# Patient Record
Sex: Male | Born: 1970
Health system: Southern US, Community
[De-identification: ages and names within clinical notes are randomized; demographics above are authoritative.]

## PROBLEM LIST (undated history)

## (undated) DIAGNOSIS — G250 Essential tremor: Secondary | ICD-10-CM

## (undated) DIAGNOSIS — I10 Essential (primary) hypertension: Secondary | ICD-10-CM

## (undated) DIAGNOSIS — A048 Other specified bacterial intestinal infections: Secondary | ICD-10-CM

## (undated) DIAGNOSIS — K649 Unspecified hemorrhoids: Secondary | ICD-10-CM

## (undated) DIAGNOSIS — E079 Disorder of thyroid, unspecified: Secondary | ICD-10-CM

## (undated) DIAGNOSIS — F32A Depression, unspecified: Secondary | ICD-10-CM

## (undated) DIAGNOSIS — F419 Anxiety disorder, unspecified: Secondary | ICD-10-CM

## (undated) DIAGNOSIS — K589 Irritable bowel syndrome without diarrhea: Secondary | ICD-10-CM

## (undated) DIAGNOSIS — E042 Nontoxic multinodular goiter: Secondary | ICD-10-CM

## (undated) HISTORY — DX: Other specified bacterial intestinal infections: A04.8

## (undated) HISTORY — DX: Irritable bowel syndrome, unspecified: K58.9

## (undated) HISTORY — DX: Unspecified hemorrhoids: K64.9

## (undated) HISTORY — DX: Essential tremor: G25.0

## (undated) HISTORY — DX: Disorder of thyroid, unspecified: E07.9

## (undated) HISTORY — DX: Anxiety disorder, unspecified: F41.9

## (undated) HISTORY — DX: Depression, unspecified: F32.A

---

## 1999-05-12 ENCOUNTER — Emergency Department (HOSPITAL_COMMUNITY): Admission: EM | Admit: 1999-05-12 | Discharge: 1999-05-12 | Payer: Self-pay | Admitting: Emergency Medicine

## 2004-10-15 ENCOUNTER — Emergency Department (HOSPITAL_COMMUNITY): Admission: EM | Admit: 2004-10-15 | Discharge: 2004-10-15 | Payer: Self-pay | Admitting: Family Medicine

## 2005-02-23 ENCOUNTER — Emergency Department (HOSPITAL_COMMUNITY): Admission: EM | Admit: 2005-02-23 | Discharge: 2005-02-23 | Payer: Self-pay | Admitting: Family Medicine

## 2005-12-16 ENCOUNTER — Encounter: Admission: RE | Admit: 2005-12-16 | Discharge: 2005-12-16 | Payer: Self-pay | Admitting: Occupational Medicine

## 2009-03-10 ENCOUNTER — Encounter: Admission: RE | Admit: 2009-03-10 | Discharge: 2009-03-10 | Payer: Self-pay | Admitting: Sports Medicine

## 2009-04-29 ENCOUNTER — Emergency Department (HOSPITAL_COMMUNITY): Admission: EM | Admit: 2009-04-29 | Discharge: 2009-04-29 | Payer: Self-pay | Admitting: Emergency Medicine

## 2009-10-24 ENCOUNTER — Emergency Department (HOSPITAL_COMMUNITY): Admission: EM | Admit: 2009-10-24 | Discharge: 2009-10-24 | Payer: Self-pay | Admitting: Emergency Medicine

## 2013-07-06 ENCOUNTER — Emergency Department (HOSPITAL_COMMUNITY)
Admission: EM | Admit: 2013-07-06 | Discharge: 2013-07-06 | Disposition: A | Discharge status: Home / Self Care | Payer: BC Managed Care – PPO | Attending: Emergency Medicine | Admitting: Emergency Medicine

## 2013-07-06 ENCOUNTER — Encounter (HOSPITAL_COMMUNITY): Payer: Self-pay | Admitting: *Deleted

## 2013-07-06 DIAGNOSIS — Z880 Allergy status to penicillin: Secondary | ICD-10-CM | POA: Insufficient documentation

## 2013-07-06 DIAGNOSIS — T465X5A Adverse effect of other antihypertensive drugs, initial encounter: Secondary | ICD-10-CM | POA: Insufficient documentation

## 2013-07-06 DIAGNOSIS — T783XXA Angioneurotic edema, initial encounter: Secondary | ICD-10-CM | POA: Insufficient documentation

## 2013-07-06 DIAGNOSIS — Y838 Other surgical procedures as the cause of abnormal reaction of the patient, or of later complication, without mention of misadventure at the time of the procedure: Secondary | ICD-10-CM | POA: Insufficient documentation

## 2013-07-06 DIAGNOSIS — I10 Essential (primary) hypertension: Secondary | ICD-10-CM | POA: Insufficient documentation

## 2013-07-06 HISTORY — DX: Essential (primary) hypertension: I10

## 2013-07-06 MED ORDER — DIPHENHYDRAMINE HCL 25 MG PO CAPS
25.0000 mg | ORAL_CAPSULE | Freq: Once | ORAL | Status: AC
  Administered 2013-07-06: 25 mg via ORAL
  Filled 2013-07-06: qty 1

## 2013-07-06 NOTE — ED Provider Notes (Signed)
Medical screening examination/treatment/procedure(s) were performed by non-physician practitioner and as supervising physician I was immediately available for consultation/collaboration.  Sunnie Nielsen, MD 07/06/13 [phone]

## 2013-07-06 NOTE — ED Provider Notes (Signed)
CSN: [id number]     Arrival date & time 07/06/13  0135 History   First MD Initiated Contact with Patient 07/06/13 0350     Chief Complaint  Patient presents with  . Angioedema   (Consider location/radiation/quality/duration/timing/severity/associated sxs/prior Treatment) HPI Comments: Patient presents with upper lip swelling.  He, initially thought that this was a reaction to the dental procedure.  He had yesterday, and the fact that he inadvertently bit.  His upper lip before the feeling returned.  Totally.  He denies any shortness of breath, throat, swelling, tongue swelling.  The history is provided by the patient.    Past Medical History  Diagnosis Date  . Hypertension    History reviewed. No pertinent past surgical history. History reviewed. No pertinent family history. History  Substance Use Topics  . Smoking status: Never Smoker   . Smokeless tobacco: Never Used  . Alcohol Use: No    Review of Systems  Constitutional: Negative for fever.  HENT: Negative for congestion, sore throat, rhinorrhea, trouble swallowing, neck pain and postnasal drip.   Neurological: Negative for facial asymmetry.  All other systems reviewed and are negative.    Allergies  Penicillins  Home Medications   Current Outpatient Rx  Name  Route  Sig  Dispense  Refill  . acetaminophen (TYLENOL) 500 MG tablet   Oral   Take 1,000 mg by mouth every 6 (six) hours as needed for pain.         Marland Kitchen amLODipine-benazepril (LOTREL) 10-20 MG per capsule   Oral   Take 1 capsule by mouth daily.         Marland Kitchen FLUoxetine (PROZAC WEEKLY) 90 MG DR capsule   Oral   Take 90 mg by mouth every 7 (seven) days. On Mondays         . nebivolol (BYSTOLIC) 10 MG tablet   Oral   Take 10 mg by mouth daily.          BP 177/79  Pulse 69  Temp(Src) 98.4 F (36.9 C) (Oral)  Resp 20  Ht 5\' 11"  (1.803 m)  Wt 192 lb (87.091 kg)  BMI 26.79 kg/m2  SpO2 100% Physical Exam  Vitals reviewed. Constitutional: He  appears well-developed and well-nourished.  HENT:  Head: Normocephalic.  Eyes: Pupils are equal, round, and reactive to light.  Neck: Normal range of motion.  Cardiovascular: Normal rate.   Pulmonary/Chest: Effort normal.  Abdominal: Soft.  Musculoskeletal: Normal range of motion.  Neurological: He is alert.  Skin: Skin is warm and dry.    ED Course  Procedures (including critical care time) Labs Review Labs Reviewed - No data to display Imaging Review No results found.  MDM   1. Angioedema of lips, initial encounter     I recommended that the patient.  Stop taking his Lotrel to continue his diastolic is to call his primary care physician.  This morning to report his condition.  I discussed alternative blood pressure medications Patient was given a dose of Benadryl in the emergency department, with little change in his symptoms, making me believe that this is not an allergic reaction, but indeed true, angioedema    Arman Filter, NP 07/06/13 [phone]

## 2013-07-06 NOTE — ED Notes (Signed)
Pt presents with upper lip swelling. Pt states feeling like his lip was started to swell around 1900 last night. Pt awoke from sleep with major swelling in his upper lip. Pt states he had dental work done yesterday morning. Respirations equal and unlabored, skin warm and dry, A&Ox4. Pt states he has already noticed some decrease in swelling since he woke up 45 minutes ago. No other symptoms noted

## 2016-04-03 DIAGNOSIS — R1030 Lower abdominal pain, unspecified: Secondary | ICD-10-CM | POA: Diagnosis not present

## 2016-04-03 DIAGNOSIS — M79652 Pain in left thigh: Secondary | ICD-10-CM | POA: Diagnosis not present

## 2016-04-03 DIAGNOSIS — M79651 Pain in right thigh: Secondary | ICD-10-CM | POA: Diagnosis not present

## 2016-04-09 DIAGNOSIS — E669 Obesity, unspecified: Secondary | ICD-10-CM | POA: Diagnosis not present

## 2016-04-09 DIAGNOSIS — I10 Essential (primary) hypertension: Secondary | ICD-10-CM | POA: Diagnosis not present

## 2016-04-18 DIAGNOSIS — R1030 Lower abdominal pain, unspecified: Secondary | ICD-10-CM | POA: Diagnosis not present

## 2016-09-17 DIAGNOSIS — M542 Cervicalgia: Secondary | ICD-10-CM | POA: Diagnosis not present

## 2016-10-29 DIAGNOSIS — I10 Essential (primary) hypertension: Secondary | ICD-10-CM | POA: Diagnosis not present

## 2016-10-29 DIAGNOSIS — F419 Anxiety disorder, unspecified: Secondary | ICD-10-CM | POA: Diagnosis not present

## 2016-10-29 DIAGNOSIS — F321 Major depressive disorder, single episode, moderate: Secondary | ICD-10-CM | POA: Diagnosis not present

## 2016-11-25 DIAGNOSIS — M722 Plantar fascial fibromatosis: Secondary | ICD-10-CM | POA: Diagnosis not present

## 2016-11-25 DIAGNOSIS — G5762 Lesion of plantar nerve, left lower limb: Secondary | ICD-10-CM | POA: Diagnosis not present

## 2016-11-25 DIAGNOSIS — M2042 Other hammer toe(s) (acquired), left foot: Secondary | ICD-10-CM | POA: Diagnosis not present

## 2016-11-25 DIAGNOSIS — M7752 Other enthesopathy of left foot: Secondary | ICD-10-CM | POA: Diagnosis not present

## 2017-04-28 DIAGNOSIS — M1990 Unspecified osteoarthritis, unspecified site: Secondary | ICD-10-CM | POA: Diagnosis not present

## 2017-04-28 DIAGNOSIS — I10 Essential (primary) hypertension: Secondary | ICD-10-CM | POA: Diagnosis not present

## 2017-04-28 DIAGNOSIS — F419 Anxiety disorder, unspecified: Secondary | ICD-10-CM | POA: Diagnosis not present

## 2017-05-19 ENCOUNTER — Ambulatory Visit (INDEPENDENT_AMBULATORY_CARE_PROVIDER_SITE_OTHER): Payer: BLUE CROSS/BLUE SHIELD | Admitting: Podiatry

## 2017-05-19 ENCOUNTER — Ambulatory Visit (INDEPENDENT_AMBULATORY_CARE_PROVIDER_SITE_OTHER): Payer: BLUE CROSS/BLUE SHIELD

## 2017-05-19 ENCOUNTER — Encounter: Payer: Self-pay | Admitting: Podiatry

## 2017-05-19 VITALS — BP 149/91 | HR 55 | Resp 18

## 2017-05-19 DIAGNOSIS — S8992XA Unspecified injury of left lower leg, initial encounter: Secondary | ICD-10-CM

## 2017-05-19 DIAGNOSIS — S99922A Unspecified injury of left foot, initial encounter: Secondary | ICD-10-CM

## 2017-05-19 DIAGNOSIS — G5762 Lesion of plantar nerve, left lower limb: Secondary | ICD-10-CM

## 2017-05-19 DIAGNOSIS — G5782 Other specified mononeuropathies of left lower limb: Secondary | ICD-10-CM

## 2017-05-19 MED ORDER — MELOXICAM 15 MG PO TABS: 15.0000 mg | ORAL_TABLET | Freq: Every day | ORAL | 2 refills | Status: AC

## 2017-05-20 ENCOUNTER — Telehealth: Payer: Self-pay | Admitting: *Deleted

## 2017-05-20 DIAGNOSIS — D3610 Benign neoplasm of peripheral nerves and autonomic nervous system, unspecified: Secondary | ICD-10-CM

## 2017-05-20 DIAGNOSIS — S99922A Unspecified injury of left foot, initial encounter: Secondary | ICD-10-CM

## 2017-05-20 NOTE — Telephone Encounter (Addendum)
-----   Message from Trula Slade, DPM sent at 05/19/2017  3:13 PM EDT ----- Can you please order a diagnostic ultrasound of the left foot to rule out a neuroma of the 2nd interspace and also to look for a flexor tendon injury/plantar plate injury to the 2nd toe. 05/20/2017-Faxed orders to [place].

## 2017-05-20 NOTE — Progress Notes (Signed)
Subjective:    Patient ID: Earl Hart, male   DOB: 46 y.o.   MRN: [id number]   HPI Mr. Kazi Reppond the office they for concerns of left foot pain which is been ongoing since February 2018. He states that he has done some research online and he feels that he may have a neuroma. He did see another physician for this several months ago and first started he had a steroid injection to the bottom of his foot and to the area which has helped about 1 month. HEENT was told he may have a neuroma but was unsure. He gets occasional numbness and tingling in on a consistent basis. He states that it does get numbness or tingling is in the second and third toes with very intermittent. He denies any recent injury or trauma. He does like to run and do other sporting events but due to pain use had to decrease. Denies any swelling or redness. He said no other complaints.  Review of Systems  All other systems reviewed and are negative.       Objective:  Physical Exam General: AAO x3, NAD  Dermatological: Skin is warm, dry and supple bilateral. Nails x 10 are well manicured; remaining integument appears unremarkable at this time. There are no open sores, no preulcerative lesions, no rash or signs of infection present.  Vascular: Dorsalis Pedis artery and Posterior Tibial artery pedal pulses are 2/4 bilateral with immedate capillary fill time.  There is no pain with calf compression, swelling, warmth, erythema.   Neruologic: Grossly intact via light touch bilateral. Vibratory intact via tuning fork bilateral. Protective threshold with Semmes Wienstein monofilament intact to all pedal sites bilateral.   Musculoskeletal: There is mild tenderness palpation, second interspace left foot. The greatest tenderness is appear to be in the plantar aspect left second toe distal to the MPJ on the plantar aspect of the digit on the plantar plates last flexor tendon. There is no gross subluxation of the MPJ. There is no  overlying edema, erythema, increase in warmth. Unable to palpate a neuroma. There is no area pinpoint bony tenderness or pain the vibratory sensation. There is no overlying edema, erythema, increase in warmth. Muscular strength 5/5 in all groups tested bilateral.  Gait: Unassisted, Nonantalgic.      Assessment:     46 year old male likely plantar plate injury/flexor injury    Plan:     -Treatment options discussed including all alternatives, risks, and complications -Etiology of symptoms were discussed -X-rays were obtained and reviewed with the patient. No evidence of acute fracture identified. Elongated first metatarsal. -At this time is getting more plantar plates last flexor injury. In ultrasound and longevity symptoms to rule out a neuroma and also evaluate the plantar plates last flexor tendon. This is ordered today. -I did show him how to tape the toe down in a plantarflexed position. -Prescribed mobic. Discussed side effects of the medication and directed to stop if any are to occur and call the office.  -RTC after u/s or sooner if needed.  Celesta Gentile, DPM

## 2017-05-23 ENCOUNTER — Ambulatory Visit
Admission: RE | Admit: 2017-05-23 | Discharge: 2017-05-23 | Disposition: A | Discharge status: Home / Self Care | Payer: BLUE CROSS/BLUE SHIELD | Source: Ambulatory Visit | Attending: Podiatry | Admitting: Podiatry

## 2017-05-23 DIAGNOSIS — D3610 Benign neoplasm of peripheral nerves and autonomic nervous system, unspecified: Secondary | ICD-10-CM | POA: Diagnosis not present

## 2017-05-23 DIAGNOSIS — S99922A Unspecified injury of left foot, initial encounter: Secondary | ICD-10-CM

## 2017-05-23 DIAGNOSIS — S8992XA Unspecified injury of left lower leg, initial encounter: Secondary | ICD-10-CM | POA: Diagnosis not present

## 2017-05-27 ENCOUNTER — Telehealth: Payer: Self-pay | Admitting: Podiatry

## 2017-05-27 NOTE — Telephone Encounter (Signed)
I'm returning the nurse Jessica's phone call. She called me yesterday and left a message that she was going to go over the ultrasound results from the ultrasound test I had done last week.Marland Kitchen

## 2017-05-30 ENCOUNTER — Telehealth: Payer: Self-pay

## 2017-05-30 ENCOUNTER — Other Ambulatory Visit: Payer: Self-pay

## 2017-05-30 DIAGNOSIS — D3610 Benign neoplasm of peripheral nerves and autonomic nervous system, unspecified: Secondary | ICD-10-CM

## 2017-05-30 DIAGNOSIS — G5782 Other specified mononeuropathies of left lower limb: Secondary | ICD-10-CM

## 2017-05-30 DIAGNOSIS — S99922A Unspecified injury of left foot, initial encounter: Secondary | ICD-10-CM

## 2017-05-30 NOTE — Telephone Encounter (Signed)
-----   Message from Trula Slade, DPM sent at 05/26/2017 12:06 PM EDT ----- No neuroma. Also, no definitive evidence of flexor injury. Please schedule follow-up in 2 weeks to see how he is doing but please let him know the results.

## 2017-05-30 NOTE — Telephone Encounter (Signed)
Informed patient of ultrasound results, he states that his pain is not getting any better, and was asking if an MRI would be our next step.

## 2017-05-30 NOTE — Telephone Encounter (Signed)
Yes, please order an MRI if he is able to get it.

## 2017-06-10 DIAGNOSIS — M7711 Lateral epicondylitis, right elbow: Secondary | ICD-10-CM | POA: Diagnosis not present

## 2017-06-18 ENCOUNTER — Other Ambulatory Visit: Payer: Self-pay | Admitting: Podiatry

## 2017-06-18 ENCOUNTER — Ambulatory Visit
Admission: RE | Admit: 2017-06-18 | Discharge: 2017-06-18 | Disposition: A | Discharge status: Home / Self Care | Payer: BLUE CROSS/BLUE SHIELD | Source: Ambulatory Visit | Attending: Podiatry | Admitting: Podiatry

## 2017-06-18 DIAGNOSIS — M79672 Pain in left foot: Secondary | ICD-10-CM | POA: Diagnosis not present

## 2017-06-18 DIAGNOSIS — D3610 Benign neoplasm of peripheral nerves and autonomic nervous system, unspecified: Secondary | ICD-10-CM

## 2017-06-18 DIAGNOSIS — S99922A Unspecified injury of left foot, initial encounter: Secondary | ICD-10-CM

## 2017-06-18 DIAGNOSIS — G5782 Other specified mononeuropathies of left lower limb: Secondary | ICD-10-CM

## 2017-07-04 ENCOUNTER — Encounter: Payer: Self-pay | Admitting: Podiatry

## 2017-07-04 ENCOUNTER — Ambulatory Visit (INDEPENDENT_AMBULATORY_CARE_PROVIDER_SITE_OTHER): Payer: BLUE CROSS/BLUE SHIELD | Admitting: Podiatry

## 2017-07-04 DIAGNOSIS — M779 Enthesopathy, unspecified: Secondary | ICD-10-CM

## 2017-07-04 MED ORDER — DICLOFENAC SODIUM 1 % TD GEL: 2.0000 g | Freq: Four times a day (QID) | TRANSDERMAL | 2 refills | Status: DC

## 2017-07-04 MED ORDER — CLOTRIMAZOLE-BETAMETHASONE 1-0.05 % EX CREA: 1.0000 "application " | TOPICAL_CREAM | Freq: Two times a day (BID) | CUTANEOUS | 0 refills | Status: DC

## 2017-07-08 NOTE — Progress Notes (Signed)
Subjective: Earl Hart presents to the office they for follow-up evaluation of left foot pain and discussed MRI results. He states he is doing somewhat better to the left foot he has continued to run and be active. Overall his foot is improving but he does continue to have pain which is been ongoing since February 2018. He denies any specific injury or trauma to his foot. He has not been having any swelling. Denies any systemic complaints such as fevers, chills, nausea, vomiting. No acute changes since last appointment, and no other complaints at this time.   Objective: AAO x3, NAD DP/PT pulses palpable bilaterally, CRT less than 3 seconds Discontinuation tenderness on palpation to the second interspace and left foot. Unable to palpate any neuroma. Is also tenderness along the first interspace. There is no pain with MPJ range of motion. There is no overlying edema, erythema, increase in warmth. There is no specific area pinpoint bony tenderness or pain the vibratory sensation.  No open lesions or pre-ulcerative lesions.  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   Plan: -All treatment options discussed with the patient including all alternatives, risks, complications.  -MRI results were discussed with the patient. He declined surgical shoe vented allograft side insert inside of his shoe to help stiffen this. As he is doing better and we'll hold off on the steroid injection today but discussed that this is an option for him as well. Continue ice to the area. Discussed decrease in activity level until pain decreases.  -Follow-up in 6 weeks if symptoms continue or sooner if needed.  -Patient encouraged to call the office with any questions, concerns, change in symptoms.   Celesta Gentile, DPM

## 2017-07-10 DIAGNOSIS — M7711 Lateral epicondylitis, right elbow: Secondary | ICD-10-CM | POA: Diagnosis not present

## 2017-07-29 DIAGNOSIS — L245 Irritant contact dermatitis due to other chemical products: Secondary | ICD-10-CM | POA: Diagnosis not present

## 2017-08-22 ENCOUNTER — Ambulatory Visit: Payer: BLUE CROSS/BLUE SHIELD | Admitting: Podiatry

## 2017-10-30 DIAGNOSIS — F410 Panic disorder [episodic paroxysmal anxiety] without agoraphobia: Secondary | ICD-10-CM | POA: Diagnosis not present

## 2017-10-30 DIAGNOSIS — M1990 Unspecified osteoarthritis, unspecified site: Secondary | ICD-10-CM | POA: Diagnosis not present

## 2017-10-30 DIAGNOSIS — I10 Essential (primary) hypertension: Secondary | ICD-10-CM | POA: Diagnosis not present

## 2018-02-11 DIAGNOSIS — H6240 Otitis externa in other diseases classified elsewhere, unspecified ear: Secondary | ICD-10-CM | POA: Diagnosis not present

## 2018-02-11 DIAGNOSIS — B369 Superficial mycosis, unspecified: Secondary | ICD-10-CM | POA: Diagnosis not present

## 2018-02-25 DIAGNOSIS — B369 Superficial mycosis, unspecified: Secondary | ICD-10-CM | POA: Diagnosis not present

## 2018-02-25 DIAGNOSIS — H6240 Otitis externa in other diseases classified elsewhere, unspecified ear: Secondary | ICD-10-CM | POA: Diagnosis not present

## 2018-02-25 DIAGNOSIS — H6983 Other specified disorders of Eustachian tube, bilateral: Secondary | ICD-10-CM | POA: Diagnosis not present

## 2018-04-22 DIAGNOSIS — M542 Cervicalgia: Secondary | ICD-10-CM | POA: Diagnosis not present

## 2018-06-25 DIAGNOSIS — I10 Essential (primary) hypertension: Secondary | ICD-10-CM | POA: Diagnosis not present

## 2018-06-25 DIAGNOSIS — F321 Major depressive disorder, single episode, moderate: Secondary | ICD-10-CM | POA: Diagnosis not present

## 2018-06-25 DIAGNOSIS — F410 Panic disorder [episodic paroxysmal anxiety] without agoraphobia: Secondary | ICD-10-CM | POA: Diagnosis not present

## 2018-06-25 DIAGNOSIS — M799 Soft tissue disorder, unspecified: Secondary | ICD-10-CM | POA: Diagnosis not present

## 2018-10-23 DIAGNOSIS — J329 Chronic sinusitis, unspecified: Secondary | ICD-10-CM | POA: Diagnosis not present

## 2018-10-23 DIAGNOSIS — J300 Vasomotor rhinitis: Secondary | ICD-10-CM | POA: Diagnosis not present

## 2018-11-17 DIAGNOSIS — J300 Vasomotor rhinitis: Secondary | ICD-10-CM | POA: Diagnosis not present

## 2018-11-17 DIAGNOSIS — H6993 Unspecified Eustachian tube disorder, bilateral: Secondary | ICD-10-CM | POA: Diagnosis not present

## 2018-11-17 DIAGNOSIS — J310 Chronic rhinitis: Secondary | ICD-10-CM | POA: Diagnosis not present

## 2019-01-15 DIAGNOSIS — I10 Essential (primary) hypertension: Secondary | ICD-10-CM | POA: Diagnosis not present

## 2019-01-15 DIAGNOSIS — F321 Major depressive disorder, single episode, moderate: Secondary | ICD-10-CM | POA: Diagnosis not present

## 2019-01-15 DIAGNOSIS — F410 Panic disorder [episodic paroxysmal anxiety] without agoraphobia: Secondary | ICD-10-CM | POA: Diagnosis not present

## 2019-01-15 DIAGNOSIS — M799 Soft tissue disorder, unspecified: Secondary | ICD-10-CM | POA: Diagnosis not present

## 2019-01-19 ENCOUNTER — Other Ambulatory Visit: Payer: Self-pay | Admitting: Pulmonary Disease

## 2019-01-19 DIAGNOSIS — E041 Nontoxic single thyroid nodule: Secondary | ICD-10-CM

## 2019-01-27 ENCOUNTER — Ambulatory Visit
Admission: RE | Admit: 2019-01-27 | Discharge: 2019-01-27 | Disposition: A | Discharge status: Home / Self Care | Payer: BLUE CROSS/BLUE SHIELD | Source: Ambulatory Visit | Attending: Pulmonary Disease | Admitting: Pulmonary Disease

## 2019-01-27 ENCOUNTER — Other Ambulatory Visit: Payer: Self-pay

## 2019-01-27 DIAGNOSIS — E041 Nontoxic single thyroid nodule: Secondary | ICD-10-CM | POA: Diagnosis not present

## 2019-02-01 ENCOUNTER — Other Ambulatory Visit: Payer: Self-pay | Admitting: Pulmonary Disease

## 2019-02-01 DIAGNOSIS — R5381 Other malaise: Secondary | ICD-10-CM

## 2019-03-05 ENCOUNTER — Other Ambulatory Visit: Payer: BLUE CROSS/BLUE SHIELD

## 2019-03-09 ENCOUNTER — Other Ambulatory Visit: Payer: Self-pay | Admitting: Pulmonary Disease

## 2019-03-09 DIAGNOSIS — E041 Nontoxic single thyroid nodule: Secondary | ICD-10-CM

## 2019-03-11 ENCOUNTER — Other Ambulatory Visit (HOSPITAL_COMMUNITY)
Admission: RE | Admit: 2019-03-11 | Discharge: 2019-03-11 | Disposition: A | Discharge status: Home / Self Care | Payer: BLUE CROSS/BLUE SHIELD | Source: Ambulatory Visit | Attending: Radiology | Admitting: Radiology

## 2019-03-11 ENCOUNTER — Other Ambulatory Visit: Payer: Self-pay

## 2019-03-11 ENCOUNTER — Ambulatory Visit
Admission: RE | Admit: 2019-03-11 | Discharge: 2019-03-11 | Disposition: A | Discharge status: Home / Self Care | Payer: BLUE CROSS/BLUE SHIELD | Source: Ambulatory Visit | Attending: Pulmonary Disease | Admitting: Pulmonary Disease

## 2019-03-11 DIAGNOSIS — E041 Nontoxic single thyroid nodule: Secondary | ICD-10-CM | POA: Insufficient documentation

## 2019-03-17 DIAGNOSIS — E049 Nontoxic goiter, unspecified: Secondary | ICD-10-CM | POA: Diagnosis not present

## 2019-03-17 DIAGNOSIS — J301 Allergic rhinitis due to pollen: Secondary | ICD-10-CM | POA: Diagnosis not present

## 2019-03-29 DIAGNOSIS — H1045 Other chronic allergic conjunctivitis: Secondary | ICD-10-CM | POA: Diagnosis not present

## 2019-03-29 DIAGNOSIS — J309 Allergic rhinitis, unspecified: Secondary | ICD-10-CM | POA: Diagnosis not present

## 2019-03-29 DIAGNOSIS — J3089 Other allergic rhinitis: Secondary | ICD-10-CM | POA: Diagnosis not present

## 2019-04-09 DIAGNOSIS — M542 Cervicalgia: Secondary | ICD-10-CM | POA: Diagnosis not present

## 2019-04-24 DIAGNOSIS — M542 Cervicalgia: Secondary | ICD-10-CM | POA: Diagnosis not present

## 2019-04-30 DIAGNOSIS — M542 Cervicalgia: Secondary | ICD-10-CM | POA: Diagnosis not present

## 2019-06-01 DIAGNOSIS — H9209 Otalgia, unspecified ear: Secondary | ICD-10-CM | POA: Diagnosis not present

## 2019-06-01 DIAGNOSIS — J310 Chronic rhinitis: Secondary | ICD-10-CM | POA: Diagnosis not present

## 2019-06-01 DIAGNOSIS — J343 Hypertrophy of nasal turbinates: Secondary | ICD-10-CM | POA: Diagnosis not present

## 2019-06-01 DIAGNOSIS — J342 Deviated nasal septum: Secondary | ICD-10-CM | POA: Diagnosis not present

## 2019-06-01 DIAGNOSIS — D440 Neoplasm of uncertain behavior of thyroid gland: Secondary | ICD-10-CM | POA: Diagnosis not present

## 2019-06-14 ENCOUNTER — Telehealth: Payer: Self-pay | Admitting: Internal Medicine

## 2019-06-14 NOTE — Telephone Encounter (Signed)
Patient called about his referral. He stated he spoke to someone last week and the referral was in review.  Calling to check status

## 2019-07-05 ENCOUNTER — Other Ambulatory Visit: Payer: Self-pay | Admitting: Family Medicine

## 2019-07-05 DIAGNOSIS — B9681 Helicobacter pylori [H. pylori] as the cause of diseases classified elsewhere: Secondary | ICD-10-CM | POA: Diagnosis not present

## 2019-07-05 DIAGNOSIS — R1011 Right upper quadrant pain: Secondary | ICD-10-CM

## 2019-07-05 DIAGNOSIS — R109 Unspecified abdominal pain: Secondary | ICD-10-CM | POA: Diagnosis not present

## 2019-07-05 NOTE — Progress Notes (Signed)
6 mo increasing frequency and intensity of RUQ pain. Typically after meals. 3 days ago pain came on acutely after meal of salad and fried fish. Stools became lighter and softer and more explosive. Constant pain w/ waxing and waning nature. Still w/ protein intake since that time w/o further worsening. FMHx of Cholecystectomy. No ETOH use.    Linna Darner, MD Family Medicine 07/05/2019, 10:27 AM

## 2019-07-05 NOTE — Addendum Note (Signed)
Addended by: Marily Memos, DAVID J on: 07/05/2019 10:41 AM   Modules accepted: Orders

## 2019-07-06 ENCOUNTER — Other Ambulatory Visit: Payer: BLUE CROSS/BLUE SHIELD

## 2019-07-06 ENCOUNTER — Ambulatory Visit
Admission: RE | Admit: 2019-07-06 | Discharge: 2019-07-06 | Disposition: A | Discharge status: Home / Self Care | Payer: BLUE CROSS/BLUE SHIELD | Source: Ambulatory Visit | Attending: Family Medicine | Admitting: Family Medicine

## 2019-07-06 DIAGNOSIS — K824 Cholesterolosis of gallbladder: Secondary | ICD-10-CM | POA: Diagnosis not present

## 2019-07-06 DIAGNOSIS — E042 Nontoxic multinodular goiter: Secondary | ICD-10-CM | POA: Diagnosis not present

## 2019-07-06 DIAGNOSIS — R1011 Right upper quadrant pain: Secondary | ICD-10-CM

## 2019-07-06 NOTE — Telephone Encounter (Signed)
Referral in proficient awaiting review

## 2019-07-07 ENCOUNTER — Other Ambulatory Visit: Payer: Self-pay | Admitting: Family Medicine

## 2019-07-08 ENCOUNTER — Other Ambulatory Visit: Payer: Self-pay | Admitting: Otolaryngology

## 2019-07-09 ENCOUNTER — Other Ambulatory Visit: Payer: Self-pay | Admitting: Family Medicine

## 2019-07-09 ENCOUNTER — Other Ambulatory Visit (HOSPITAL_COMMUNITY): Payer: Self-pay | Admitting: Family Medicine

## 2019-07-09 DIAGNOSIS — R1013 Epigastric pain: Secondary | ICD-10-CM

## 2019-07-09 DIAGNOSIS — R1084 Generalized abdominal pain: Secondary | ICD-10-CM

## 2019-07-09 DIAGNOSIS — R1011 Right upper quadrant pain: Secondary | ICD-10-CM

## 2019-07-09 DIAGNOSIS — R195 Other fecal abnormalities: Secondary | ICD-10-CM

## 2019-07-09 NOTE — Progress Notes (Signed)
9.21.20 Epigastric pain. Started Friday acutely after meal. Radiation to RUQ and less so to back. Sharp in nature. Episodes similar to this have occured multiple times over past 6 mo. THis episode far worse than any previous episodes. Associated w/ feeling week and shaky as well as very loose and lighter colored stool. Explosive type BMs. Sometimes green. No bloating. Meal just prior to onset of sx was fried fish and salad. States very little to no pork or beef intake. Rare poultry intake. Afebrile. Pain is constant w/ waxing and waning nature.   07/09/19 after return of H.Pylori Iga and IgG positive (9.23.20) pt started on quadruple therapy. Minimal improvement after 2 days of therapy. Stools remain loose. Dark since starting pepto.   Discussed ED eval vs GI vs MRI.   Pt opting for MRI.

## 2019-07-16 ENCOUNTER — Ambulatory Visit
Admission: RE | Admit: 2019-07-16 | Discharge: 2019-07-16 | Disposition: A | Discharge status: Home / Self Care | Payer: BC Managed Care – PPO | Source: Ambulatory Visit | Attending: Family Medicine | Admitting: Family Medicine

## 2019-07-16 DIAGNOSIS — R1013 Epigastric pain: Secondary | ICD-10-CM | POA: Diagnosis not present

## 2019-07-16 DIAGNOSIS — R1011 Right upper quadrant pain: Secondary | ICD-10-CM | POA: Diagnosis not present

## 2019-07-16 DIAGNOSIS — R195 Other fecal abnormalities: Secondary | ICD-10-CM

## 2019-07-16 MED ORDER — GADOBENATE DIMEGLUMINE 529 MG/ML IV SOLN
18.0000 mL | Freq: Once | INTRAVENOUS | Status: AC | PRN
  Administered 2019-07-16: 18 mL via INTRAVENOUS

## 2019-07-19 NOTE — Telephone Encounter (Signed)
Pt is being seen elsewhere °

## 2019-07-23 ENCOUNTER — Ambulatory Visit: Payer: Self-pay | Admitting: Surgery

## 2019-07-23 DIAGNOSIS — E042 Nontoxic multinodular goiter: Secondary | ICD-10-CM | POA: Diagnosis not present

## 2019-07-23 DIAGNOSIS — E049 Nontoxic goiter, unspecified: Secondary | ICD-10-CM | POA: Diagnosis not present

## 2019-07-23 DIAGNOSIS — J398 Other specified diseases of upper respiratory tract: Secondary | ICD-10-CM | POA: Diagnosis not present

## 2019-08-02 ENCOUNTER — Encounter: Payer: Self-pay | Admitting: Physician Assistant

## 2019-08-03 ENCOUNTER — Encounter (HOSPITAL_BASED_OUTPATIENT_CLINIC_OR_DEPARTMENT_OTHER): Payer: Self-pay

## 2019-08-03 ENCOUNTER — Ambulatory Visit (HOSPITAL_BASED_OUTPATIENT_CLINIC_OR_DEPARTMENT_OTHER): Admit: 2019-08-03 | Payer: BC Managed Care – PPO | Admitting: Otolaryngology

## 2019-08-03 SURGERY — THYROIDECTOMY
Anesthesia: General

## 2019-08-06 ENCOUNTER — Other Ambulatory Visit: Payer: Self-pay

## 2019-08-06 ENCOUNTER — Ambulatory Visit (INDEPENDENT_AMBULATORY_CARE_PROVIDER_SITE_OTHER): Payer: BC Managed Care – PPO | Admitting: Physician Assistant

## 2019-08-06 ENCOUNTER — Encounter: Payer: Self-pay | Admitting: Physician Assistant

## 2019-08-06 VITALS — BP 128/82 | HR 60 | Temp 97.8°F | Ht 71.0 in | Wt 204.0 lb

## 2019-08-06 DIAGNOSIS — Z1211 Encounter for screening for malignant neoplasm of colon: Secondary | ICD-10-CM | POA: Diagnosis not present

## 2019-08-06 DIAGNOSIS — Z1159 Encounter for screening for other viral diseases: Secondary | ICD-10-CM

## 2019-08-06 DIAGNOSIS — Z1212 Encounter for screening for malignant neoplasm of rectum: Secondary | ICD-10-CM

## 2019-08-06 DIAGNOSIS — R1013 Epigastric pain: Secondary | ICD-10-CM | POA: Diagnosis not present

## 2019-08-06 DIAGNOSIS — Z8619 Personal history of other infectious and parasitic diseases: Secondary | ICD-10-CM

## 2019-08-06 MED ORDER — NA SULFATE-K SULFATE-MG SULF 17.5-3.13-1.6 GM/177ML PO SOLN: 1.0000 | ORAL | 0 refills | Status: DC

## 2019-08-06 NOTE — Patient Instructions (Addendum)
  You have been scheduled for an endoscopy and colonoscopy. Please follow the written instructions given to you at your visit today. Please pick up your prep supplies at the pharmacy within the next 1-3 days. If you use inhalers (even only as needed), please bring them with you on the day of your procedure.  Due to recent COVID-19 restrictions implemented by [employer] and state authorities and in an effort to keep both patients and staff as safe as possible, [place] requires COVID-19 testing prior to any scheduled endoscopic procedure. The testing center is located at [address] [place],  [address] in the [facility] [employer]  suite.  Your appointment has been scheduled for 8:30 am on 08-12-2019.   Please bring your insurance cards to this appointment. You will require your COVID screen 2 business days prior to your endoscopic procedure.  You are not required to quarantine after your screening.  You will only receive a phone call with the results if it is POSITIVE.  If you do not receive a call the day before your procedure you should begin your prep, if ordered, and you should report to the endo center for your procedure at your designated appointment arrival time ( one hour prior to the procedure time). There is no cost to you for the screening on the day of the swab.  [facility] will file with your insurance company for the testing.    You may receive an automated phone call prior to your procedure or have a message in your MyChart that you have an appointment for a BP/15 at the [facility], please disregard this message.  Your testing will be at the [address] [place] location.   If you are leaving [facility] travel [place] on [address], turn left onto [address], turn night onto [address] at the 1st stop light turn right, pass  the [employer] on your right and proceed to [place] (white building).

## 2019-08-06 NOTE — Progress Notes (Signed)
Chief Complaint: "Indigestion, abdominal pain and colored stools"  HPI:    [name] is a 48 year old African-American male with a past medical history as listed below, who was referred to me by Earl Du, MD for a complaint of indigestion, abdominal pain and colored stools.      07/09/2019 PCP described patient had right upper quadrant pain which started acutely on 07/05/2019 with similar episodes over the past 6 months.  Associated with feeling weak and shaky as well as with very loose and lighter colored stool, had eaten fried fish and salad just prior to symptoms.  Patient had positive H. pylori IgA and IgG on 923/20 and patient was started on quadruple therapy with minimal improvement after 2 days.    07/06/2019 right upper quadrant ultrasound showed a 3 mm gallbladder polyp and otherwise negative.    07/16/2019 MRI of the abdomen with and without contrast was negative.    Today, the patient tells me that back on 07/04/2019 he started with right upper quadrant/epigastric pain which was constant and so severe, rated as an 8-9/10, that he proceeded to have multiple tests including an ultrasound and lab work which he tells me showed H. pylori and was otherwise normal. he was started on triple therapy and took this for 2 weeks.  During those 2 weeks the patient tells me that he was having green loose stools every time he would go to the bathroom and they somewhat burned when they came out.  He used Tucks pads to help with the burn.  Also described gurgling in his abdomen.  After being on the antibiotics he continued with some shooting pains which would come and go throughout the day and which still continue now along with "noisy gurgling which is embarrassing".  Explained that he then had an MRI which was normal.  Patient tells me all of his problems are about 50% better.  His stool has returned to brown over the past 3 to 4 days.  He did start Culturelle a couple of weeks ago and does drink a daily  Kambucha per recommendations from his PCP.  Patient is still worried about what might be going on.    Patient also has heard that African-Americans are at increased risk for colon cancer and wants to know about scheduling a screening colonoscopy.    Denies fever, chills, blood in his stool, weight loss, nausea, vomiting or reflux.  Past Medical History:  Diagnosis Date   Hypertension     No past surgical history on file.  Current Outpatient Medications  Medication Sig Dispense Refill   acetaminophen (TYLENOL) 500 MG tablet Take 1,000 mg by mouth every 6 (six) hours as needed for pain.     amLODipine (NORVASC) 10 MG tablet Take 10 mg by mouth daily.  3   FLUoxetine (PROZAC WEEKLY) 90 MG DR capsule Take 90 mg by mouth every 7 (seven) days. On Mondays     hydrochlorothiazide (HYDRODIURIL) 12.5 MG tablet Take 12.5 mg by mouth daily.  3   nebivolol (BYSTOLIC) 10 MG tablet Take 10 mg by mouth daily.     No current facility-administered medications for this visit.     Allergies as of 08/06/2019 - Review Complete 07/04/2017  Allergen Reaction Noted   Penicillins Rash 07/06/2013    No family history on file.  Social History   Socioeconomic History   Marital status: Single    Spouse name: Not on file   Number of children: Not on file  Years of education: Not on file   Highest education level: Not on file  Occupational History   Not on file  Social Needs   Financial resource strain: Not on file   Food insecurity    Worry: Not on file    Inability: Not on file   Transportation needs    Medical: Not on file    Non-medical: Not on file  Tobacco Use   Smoking status: Never Smoker   Smokeless tobacco: Never Used  Substance and Sexual Activity   Alcohol use: No   Drug use: No   Sexual activity: Not on file  Lifestyle   Physical activity    Days per week: Not on file    Minutes per session: Not on file   Stress: Not on file  Relationships   Social  connections    Talks on phone: Not on file    Gets together: Not on file    Attends religious service: Not on file    Active member of club or organization: Not on file    Attends meetings of clubs or organizations: Not on file    Relationship status: Not on file   Intimate partner violence    Fear of current or ex partner: Not on file    Emotionally abused: Not on file    Physically abused: Not on file    Forced sexual activity: Not on file  Other Topics Concern   Not on file  Social History Narrative   Not on file    Review of Systems:    Constitutional: No weight loss, fever or chills Skin: No rash  Cardiovascular: No chest pain Respiratory: No SOB  Gastrointestinal: See HPI and otherwise negative Genitourinary: No dysuria Neurological: No headache, dizziness or syncope Musculoskeletal: No new muscle or joint pain Hematologic: No bleeding  Psychiatric: No history of depression or anxiety   Physical Exam:  Vital signs: BP 128/82 (BP Location: Right Arm, Patient Position: Sitting, Cuff Size: Normal)    Pulse 60    Temp 97.8 F (36.6 C) (Other (Comment))    Ht 5\' 11"  (1.803 m)    Wt 204 lb (92.5 kg)    BMI 28.45 kg/m   Constitutional:   Pleasant AA male appears to be in NAD, Well developed, Well nourished, alert and cooperative Head:  Normocephalic and atraumatic. Eyes:   PEERL, EOMI. No icterus. Conjunctiva pink. Ears:  Normal auditory acuity. Neck:  Supple Throat: Oral cavity and pharynx without inflammation, swelling or lesion.  Respiratory: Respirations even and unlabored. Lungs clear to auscultation bilaterally.   No wheezes, crackles, or rhonchi.  Cardiovascular: Normal S1, S2. No MRG. Regular rate and rhythm. No peripheral edema, cyanosis or pallor.  Gastrointestinal:  Soft, nondistended, nontender. No rebound or guarding. Normal bowel sounds. No appreciable masses or hepatomegaly. Rectal:  Not performed.  Msk:  Symmetrical without gross deformities. Without  edema, no deformity or joint abnormality.  Neurologic:  Alert and  oriented x4;  grossly normal neurologically.  Skin:   Dry and intact without significant lesions or rashes. Psychiatric:  Demonstrates good judgement and reason without abnormal affect or behaviors.  Assessment: 1.  History of H. pylori: Sounds like a positive antibody test, finished treatment 2 weeks ago 2.  Epigastric pain: Continues per the patient, though 50% better after triple therapy for H. pylori which he finished 2 weeks ago, right upper quadrant ultrasound and abdominal MRI normal; consider continued H. Pylori+/-gastritis+/-PUD 3.  Screening for colorectal cancer: Patient is  African-American and 48 years old  Plan: 1.  Scheduled patient for diagnostic EGD and screening colonoscopy in the [place] with Dr. Silverio Decamp.  Did discuss risks, benefits, limitations and alternatives. 2.  Patient will call his insurance to ensure that they will pay for an early screening for him since he is an African-American and at increased risk for CRC 3.  Tried to reassure the patient that things sound as though they are returning to normal, green stool can be a normal variant, and this is now returning to normal. 4.  Patient to follow in clinic per recommendations from Dr. Silverio Decamp after time of procedures.  Ellouise Newer, PA-C [facility] 08/06/2019, 8:27 AM  Cc: Earl Du, MD

## 2019-08-12 DIAGNOSIS — Z1159 Encounter for screening for other viral diseases: Secondary | ICD-10-CM | POA: Diagnosis not present

## 2019-08-12 LAB — SARS CORONAVIRUS 2 (TAT 6-24 HRS): SARS Coronavirus 2: NEGATIVE

## 2019-08-13 ENCOUNTER — Encounter: Payer: Self-pay | Admitting: Gastroenterology

## 2019-08-16 ENCOUNTER — Encounter: Payer: Self-pay | Admitting: Gastroenterology

## 2019-08-16 ENCOUNTER — Other Ambulatory Visit: Payer: Self-pay

## 2019-08-16 ENCOUNTER — Ambulatory Visit (AMBULATORY_SURGERY_CENTER): Payer: BC Managed Care – PPO | Admitting: Gastroenterology

## 2019-08-16 VITALS — BP 114/62 | HR 59 | Temp 99.1°F | Resp 19 | Ht 71.0 in | Wt 204.0 lb

## 2019-08-16 DIAGNOSIS — Z1211 Encounter for screening for malignant neoplasm of colon: Secondary | ICD-10-CM | POA: Diagnosis not present

## 2019-08-16 DIAGNOSIS — Z1212 Encounter for screening for malignant neoplasm of rectum: Secondary | ICD-10-CM | POA: Diagnosis not present

## 2019-08-16 DIAGNOSIS — Z8619 Personal history of other infectious and parasitic diseases: Secondary | ICD-10-CM

## 2019-08-16 DIAGNOSIS — D122 Benign neoplasm of ascending colon: Secondary | ICD-10-CM | POA: Diagnosis not present

## 2019-08-16 DIAGNOSIS — K2950 Unspecified chronic gastritis without bleeding: Secondary | ICD-10-CM | POA: Diagnosis not present

## 2019-08-16 DIAGNOSIS — D123 Benign neoplasm of transverse colon: Secondary | ICD-10-CM

## 2019-08-16 DIAGNOSIS — R1013 Epigastric pain: Secondary | ICD-10-CM

## 2019-08-16 DIAGNOSIS — K635 Polyp of colon: Secondary | ICD-10-CM | POA: Diagnosis not present

## 2019-08-16 HISTORY — PX: UPPER GASTROINTESTINAL ENDOSCOPY: SHX188

## 2019-08-16 HISTORY — PX: COLONOSCOPY: SHX174

## 2019-08-16 MED ORDER — OMEPRAZOLE 40 MG PO CPDR: 40.0000 mg | DELAYED_RELEASE_CAPSULE | Freq: Every day | ORAL | 3 refills | Status: DC

## 2019-08-16 MED ORDER — SODIUM CHLORIDE 0.9 % IV SOLN: 500.0000 mL | Freq: Once | INTRAVENOUS | Status: DC

## 2019-08-16 NOTE — Op Note (Signed)
[facility] Patient Name: Earl Hart Procedure Date: 08/16/2019 3:25 PM MRN: [id number] Endoscopist: Mauri Pole , MD Age: 47 Referring MD:  Date of Birth: July 06, 1971 Gender: Male Account #: [id number] Procedure:                Colonoscopy Indications:              Screening for colorectal malignant neoplasm Medicines:                Monitored Anesthesia Care Procedure:                Pre-Anesthesia Assessment:                           - Prior to the procedure, a History and Physical                            was performed, and patient medications and                            allergies were reviewed. The patient's tolerance of                            previous anesthesia was also reviewed. The risks                            and benefits of the procedure and the sedation                            options and risks were discussed with the patient.                            All questions were answered, and informed consent                            was obtained. Prior Anticoagulants: The patient has                            taken no previous anticoagulant or antiplatelet                            agents. ASA Grade Assessment: II - A patient with                            mild systemic disease. After reviewing the risks                            and benefits, the patient was deemed in                            satisfactory condition to undergo the procedure.                           After obtaining informed consent, the colonoscope  was passed under direct vision. Throughout the                            procedure, the patient's blood pressure, pulse, and                            oxygen saturations were monitored continuously. The                            Colonoscope was introduced through the anus and                            advanced to the the cecum, identified by                            appendiceal orifice and  ileocecal valve. The                            colonoscopy was performed without difficulty. The                            patient tolerated the procedure well. The quality                            of the bowel preparation was fair. The ileocecal                            valve, appendiceal orifice, and rectum were                            photographed. Scope In: 3:39:50 PM Scope Out: 4:01:02 PM Scope Withdrawal Time: 0 hours 16 minutes 12 seconds  Total Procedure Duration: 0 hours 21 minutes 12 seconds  Findings:                 The perianal and digital rectal examinations were                            normal.                           A less than 1 mm polyp was found in the ascending                            colon. The polyp was sessile. The polyp was removed                            with a cold biopsy forceps. Resection and retrieval                            were complete.                           A 3 mm polyp was found in the transverse colon. The  polyp was sessile. The polyp was removed with a                            cold snare. Resection and retrieval were complete.                           Non-bleeding internal hemorrhoids were found during                            retroflexion. The hemorrhoids were small.                           The exam was otherwise without abnormality. Complications:            No immediate complications. Estimated Blood Loss:     Estimated blood loss was minimal. Impression:               - Preparation of the colon was fair.                           - One less than 1 mm polyp in the ascending colon,                            removed with a cold biopsy forceps. Resected and                            retrieved.                           - One 3 mm polyp in the transverse colon, removed                            with a cold snare. Resected and retrieved.                           - Non-bleeding internal  hemorrhoids.                           - The examination was otherwise normal. Recommendation:           - Patient has a contact number available for                            emergencies. The signs and symptoms of potential                            delayed complications were discussed with the                            patient. Return to normal activities tomorrow.                            Written discharge instructions were provided to the  patient.                           - Resume previous diet.                           - Continue present medications.                           - Await pathology results.                           - Repeat colonoscopy in 5 years for surveillance                            based on pathology results. Mauri Pole, MD 08/16/2019 4:12:28 PM This report has been signed electronically.

## 2019-08-16 NOTE — Patient Instructions (Signed)
YOU HAD AN ENDOSCOPIC PROCEDURE TODAY AT THE [facility]:   Refer to the procedure report that was given to you for any specific questions about what was found during the examination.  If the procedure report does not answer your questions, please call your gastroenterologist to clarify.  If you requested that your care partner not be given the details of your procedure findings, then the procedure report has been included in a sealed envelope for you to review at your convenience later.  YOU SHOULD EXPECT: Some feelings of bloating in the abdomen. Passage of more gas than usual.  Walking can help get rid of the air that was put into your GI tract during the procedure and reduce the bloating. If you had a lower endoscopy (such as a colonoscopy or flexible sigmoidoscopy) you may notice spotting of blood in your stool or on the toilet paper. If you underwent a bowel prep for your procedure, you may not have a normal bowel movement for a few days.  Please Note:  You might notice some irritation and congestion in your nose or some drainage.  This is from the oxygen used during your procedure.  There is no need for concern and it should clear up in a day or so.  SYMPTOMS TO REPORT IMMEDIATELY:   Following lower endoscopy (colonoscopy or flexible sigmoidoscopy):  Excessive amounts of blood in the stool  Significant tenderness or worsening of abdominal pains  Swelling of the abdomen that is new, acute  Fever of 100F or higher   Following upper endoscopy (EGD)  Vomiting of blood or coffee ground material  New chest pain or pain under the shoulder blades  Painful or persistently difficult swallowing  New shortness of breath  Fever of 100F or higher  Black, tarry-looking stools  For urgent or emergent issues, a gastroenterologist can be reached at any hour by calling [phone].   DIET:  We do recommend a small meal at first, but then you may proceed to your regular diet.  Drink  plenty of fluids but you should avoid alcoholic beverages for 24 hours.  ACTIVITY:  You should plan to take it easy for the rest of today and you should NOT DRIVE or use heavy machinery until tomorrow (because of the sedation medicines used during the test).    FOLLOW UP: Our staff will call the number listed on your records 48-72 hours following your procedure to check on you and address any questions or concerns that you may have regarding the information given to you following your procedure. If we do not reach you, we will leave a message.  We will attempt to reach you two times.  During this call, we will ask if you have developed any symptoms of COVID 19. If you develop any symptoms (ie: fever, flu-like symptoms, shortness of breath, cough etc.) before then, please call [phone].  If you test positive for Covid 19 in the 2 weeks post procedure, please call and report this information to us.    If any biopsies were taken you will be contacted by phone or by letter within the next 1-3 weeks.  Please call us at [phone] if you have not heard about the biopsies in 3 weeks.    SIGNATURES/CONFIDENTIALITY: You and/or your care partner have signed paperwork which will be entered into your electronic medical record.  These signatures attest to the fact that that the information above on your After Visit Summary has been reviewed and is   understood.  Full responsibility of the confidentiality of this discharge information lies with you and/or your care-partner. 

## 2019-08-16 NOTE — Op Note (Signed)
[facility] Patient Name: Earl Hart Procedure Date: 08/16/2019 3:28 PM MRN: [id number] Endoscopist: Mauri Pole , MD Age: 48 Referring MD:  Date of Birth: [birth year] Gender: Male Account #: [id number] Procedure:                Upper GI endoscopy Indications:              Epigastric abdominal pain Medicines:                Monitored Anesthesia Care Procedure:                Pre-Anesthesia Assessment:                           - Prior to the procedure, a History and Physical                            was performed, and patient medications and                            allergies were reviewed. The patient's tolerance of                            previous anesthesia was also reviewed. The risks                            and benefits of the procedure and the sedation                            options and risks were discussed with the patient.                            All questions were answered, and informed consent                            was obtained. Prior Anticoagulants: The patient has                            taken no previous anticoagulant or antiplatelet                            agents. ASA Grade Assessment: II - A patient with                            mild systemic disease. After reviewing the risks                            and benefits, the patient was deemed in                            satisfactory condition to undergo the procedure.                           After obtaining informed consent, the endoscope was  passed under direct vision. Throughout the                            procedure, the patient's blood pressure, pulse, and                            oxygen saturations were monitored continuously. The                            Endoscope was introduced through the mouth, and                            advanced to the second part of duodenum. The upper                            GI endoscopy was  accomplished without difficulty.                            The patient tolerated the procedure well. Scope In: Scope Out: Findings:                 The examined esophagus was normal.                           Patchy mild inflammation characterized by                            congestion (edema) and erythema was found in the                            entire examined stomach. Biopsies were taken with a                            cold forceps for Helicobacter pylori testing.                           The examined duodenum was normal. Complications:            No immediate complications. Estimated Blood Loss:     Estimated blood loss was minimal. Impression:               - Normal esophagus.                           - Gastritis. Biopsied.                           - Normal examined duodenum. Recommendation:           - Patient has a contact number available for                            emergencies. The signs and symptoms of potential                            delayed complications were discussed with the  patient. Return to normal activities tomorrow.                            Written discharge instructions were provided to the                            patient.                           - Resume previous diet.                           - Continue present medications.                           - Await pathology results.                           - Use Prilosec (omeprazole) 40 mg PO daily. Mauri Pole, MD 08/16/2019 4:10:24 PM This report has been signed electronically.

## 2019-08-16 NOTE — Progress Notes (Signed)
Called to room to assist during endoscopic procedure.  Patient ID and intended procedure confirmed with present staff. Received instructions for my participation in the procedure from the performing physician.  

## 2019-08-16 NOTE — Progress Notes (Signed)
Pt. Tolerated procedure well. VSS. Awake and to recovery.

## 2019-08-17 ENCOUNTER — Telehealth: Payer: Self-pay | Admitting: Gastroenterology

## 2019-08-17 NOTE — Telephone Encounter (Signed)
Patient is advised. Suggested Gas-X, Phazyme or generic equivalent.  ER if he acutely worsens or fails to improve.

## 2019-08-17 NOTE — Telephone Encounter (Signed)
Beth, please check if he is feeling better?  Thank you

## 2019-08-17 NOTE — Telephone Encounter (Signed)
Please advise patient to take Simethicone TID as needed. Small meals as tolerated. He was very anxious and had similar chronic pain intermittently even before the procedure. It seems less likely to be related to the procedure. Also c/o oral thrush even though none was present on exam. C/o green sticky stool, I reassured him.  If symptoms significantly worse, may need to come to ER for evaluation.

## 2019-08-17 NOTE — Telephone Encounter (Signed)
Pain in the center of his abdomen below the breastbone. Tries burping or belching to alleviate the pressure/pain without success. Worse if he eats. Pain hits before he finishes eating. Pain wraps around his right side into his back. Some of this was present before the procedure and he feels it has suddenly gotten much worse. He is very concerned and very uncomfortable.

## 2019-08-17 NOTE — Telephone Encounter (Signed)
Pls call pt, he would like to speak with you about his sxs.

## 2019-08-17 NOTE — Telephone Encounter (Signed)
He called around 4AM this morning.  He's had RUQ, right chest, right back pains since his procedures yesterday.  No n/v or fevers. HE is not tender to touch.  HE can drink, eat but it hurts a bit.  He says these were all issues periodically even before his procedures yesterday.  I recommended he take another omeprazole this morning and will pass this on to Dr. Silverio Decamp.  I think this is unlikely a procedure related complication after reviewing the procedure notes and talking with him.

## 2019-08-18 ENCOUNTER — Telehealth: Payer: Self-pay

## 2019-08-18 NOTE — Telephone Encounter (Signed)
These two complaints predates the procedure, likely functional abdominal pain, work up so far is unrevealing for any significant pathology. I already reassured patient that green stool is not a concern.

## 2019-08-18 NOTE — Telephone Encounter (Signed)
  Follow up Call-  Call back number 08/16/2019  Post procedure Call Back phone  # [phone]  Permission to leave phone message Yes  Some recent data might be hidden     Patient questions:  Do you have a fever, pain , or abdominal swelling? No. Pain Score  5  Have you tolerated food without any problems? Yes.    Have you been able to return to your normal activities? Yes.    Do you have any questions about your discharge instructions: Diet   No. Medications  No. Follow up visit  No.  Do you have questions or concerns about your Care? Yes.     Actions: * If pain score is 4 or above: Physician/ provider Notified : Harl Bowie, MD.   1. Have you developed a fever since your procedure? no  2.   Have you had an respiratory symptoms (SOB or cough) since your procedure? no  3.   Have you tested positive for COVID 19 since your procedure no  4.   Have you had any family members/close contacts diagnosed with the COVID 19 since your procedure?  no   If yes to any of these questions please route to Joylene John, RN and Alphonsa Gin, Therapist, sports.

## 2019-08-23 ENCOUNTER — Other Ambulatory Visit: Payer: Self-pay

## 2019-08-23 ENCOUNTER — Telehealth: Payer: Self-pay | Admitting: Gastroenterology

## 2019-08-23 MED ORDER — COLESTIPOL HCL 1 G PO TABS: 1.0000 g | ORAL_TABLET | Freq: Two times a day (BID) | ORAL | 2 refills | Status: DC

## 2019-08-23 NOTE — Telephone Encounter (Signed)
It did get overlooked. Rx is now transmitted to the pharmacy.

## 2019-08-23 NOTE — Telephone Encounter (Signed)
Please advise him to continue Simethicone as needed for excess gas. Start Colestid 1gm BID, avoid taking it within 3 hours of other medications. Follow up with APP next available appointment. Thanks

## 2019-08-23 NOTE — Telephone Encounter (Signed)
Spoke with the patient. He expresses repeatedly that he is concerned about his symptoms.  Upper GI symptoms are improved with simethicone. The patient reports gassiness and constant gurgling and churning in his mid to lower abdomen. This produce flatus and frequent stools. His stools are light green and soft formed. His buttocks have become "raw" with his frequent stools. He has started using  Boudreaux's Butt Paste and after 2 days he feels it is helping. He has been on Culturelle probiotic for about a month. States he had these symptoms before starting the probiotic.  Please advise.

## 2019-08-23 NOTE — Telephone Encounter (Signed)
Patient is instructed. Agrees to try the Colestid. Appointment needs to be this month. He is having thyroid surgery 09/16/19. Appointment scheduled for 08/27/19 at 10:40 am.

## 2019-08-27 ENCOUNTER — Encounter: Payer: Self-pay | Admitting: Gastroenterology

## 2019-08-27 ENCOUNTER — Ambulatory Visit: Payer: BC Managed Care – PPO | Admitting: Gastroenterology

## 2019-08-27 VITALS — BP 140/80 | HR 83 | Temp 97.8°F | Ht 71.0 in | Wt 198.0 lb

## 2019-08-27 DIAGNOSIS — K9089 Other intestinal malabsorption: Secondary | ICD-10-CM

## 2019-08-27 DIAGNOSIS — Z8619 Personal history of other infectious and parasitic diseases: Secondary | ICD-10-CM

## 2019-08-27 DIAGNOSIS — K580 Irritable bowel syndrome with diarrhea: Secondary | ICD-10-CM

## 2019-08-27 MED ORDER — HYOSCYAMINE SULFATE SL 0.125 MG SL SUBL: SUBLINGUAL_TABLET | SUBLINGUAL | 3 refills | Status: DC

## 2019-08-27 NOTE — Patient Instructions (Signed)
Take IBGard 1 capsule three times a day  We will send Levsin to your pharmacy  Follow up in 6 months  If you are age 49 or older, your body mass index should be between 23-30. Your Body mass index is 27.62 kg/m. If this is out of the aforementioned range listed, please consider follow up with your Primary Care Provider.  If you are age 21 or younger, your body mass index should be between 19-25. Your Body mass index is 27.62 kg/m. If this is out of the aformentioned range listed, please consider follow up with your Primary Care Provider.    I appreciate the  opportunity to care for you  Thank You   Harl Bowie , MD

## 2019-08-27 NOTE — Progress Notes (Signed)
Earl Hart    [id number]    29-Oct-1970  Primary Care Physician:Hawkins, Percell Miller, MD  Referring Physician: Sinda Du, MD [address] [place],  [place] [address]   Chief complaint: [name] stool, abdominal cramping HPI: 48 year old African-American male here for follow-up visit after EGD and colonoscopy.  He started having abdominal discomfort, cramping and increased flatulence with loud intestinal sounds.  He has 3-4 bowel movements daily.  Usually the first 1 is large and subsequently only a small amount of stool comes out per patient.  He is having light green stool, remains very concerned regarding his symptoms. He feels most of his symptoms started after he was diagnosed with H. pylori and treated for it in September 2020. Denies any nausea, vomiting, melena or blood per rectum.  No family history of IBD or GI malignancy.  He has had extensive GI work-up including imaging abdominal ultrasound, MR abdomen, EGD and colonoscopy, all negative for any specific GI pathology.  EGD August 16, 2019 showed mild gastritis, negative for H. pylori otherwise normal exam. Colonoscopy August 16, 2019: Diminutive polyps [hyperplastic] and internal hemorrhoids  Outpatient Encounter Medications as of 08/27/2019  Medication Sig  . amLODipine (NORVASC) 10 MG tablet Take 10 mg by mouth daily.  . colestipol (COLESTID) 1 g tablet Take 1 tablet (1 g total) by mouth 2 (two) times daily.  Marland Kitchen FLUoxetine (PROZAC WEEKLY) 90 MG DR capsule Take 90 mg by mouth every 7 (seven) days. On Mondays  . nebivolol (BYSTOLIC) 10 MG tablet Take 10 mg by mouth daily.  Marland Kitchen omeprazole (PRILOSEC) 40 MG capsule Take 1 capsule (40 mg total) by mouth daily.  . [DISCONTINUED] fluconazole (DIFLUCAN) 100 MG tablet Take 100 mg by mouth daily.   No facility-administered encounter medications on file as of 08/27/2019.     Allergies as of 08/27/2019 - Review Complete 08/27/2019  Allergen Reaction Noted  .  Penicillins Rash 07/06/2013    Past Medical History:  Diagnosis Date  . Anxiety   . H. pylori infection   . Hypertension   . Thyroid disease    surgery planned 09/2019    Past Surgical History:  Procedure Laterality Date  . COLONOSCOPY  08/16/2019  . UPPER GASTROINTESTINAL ENDOSCOPY      Family History  Problem Relation Age of Onset  . Hypertension Mother   . Hypertension Father   . Esophageal cancer Neg Hx   . Pancreatic cancer Neg Hx   . Stomach cancer Neg Hx   . Rectal cancer Neg Hx   . Liver disease Neg Hx   . Liver cancer Neg Hx   . Colon cancer Neg Hx   . Colon polyps Neg Hx     Social History   Socioeconomic History  . Marital status: Single    Spouse name: Not on file  . Number of children: Not on file  . Years of education: Not on file  . Highest education level: Not on file  Occupational History  . Not on file  Social Needs  . Financial resource strain: Not on file  . Food insecurity    Worry: Not on file    Inability: Not on file  . Transportation needs    Medical: Not on file    Non-medical: Not on file  Tobacco Use  . Smoking status: Never Smoker  . Smokeless tobacco: Never Used  Substance and Sexual Activity  . Alcohol use: No  . Drug use: Never  .  Sexual activity: Not on file  Lifestyle  . Physical activity    Days per week: Not on file    Minutes per session: Not on file  . Stress: Not on file  Relationships  . Social Herbalist on phone: Not on file    Gets together: Not on file    Attends religious service: Not on file    Active member of club or organization: Not on file    Attends meetings of clubs or organizations: Not on file    Relationship status: Not on file  . Intimate partner violence    Fear of current or ex partner: Not on file    Emotionally abused: Not on file    Physically abused: Not on file    Forced sexual activity: Not on file  Other Topics Concern  . Not on file  Social History Narrative  .  Not on file      Review of systems: Review of Systems  Constitutional: Negative for fever and chills.  HENT: Negative.   Eyes: Negative for blurred vision.  Respiratory: Negative for cough, shortness of breath and wheezing.   Cardiovascular: Negative for chest pain and palpitations.  Gastrointestinal: as per HPI Genitourinary: Negative for dysuria, urgency, frequency and hematuria.  Musculoskeletal: Negative for myalgias, back pain and joint pain.  Skin: Negative for itching and rash.  Neurological: Negative for dizziness, tremors, focal weakness, seizures and loss of consciousness.  Endo/Heme/Allergies: Positive for seasonal allergies.  Psychiatric/Behavioral: Negative for depression, suicidal ideas and hallucinations.  All other systems reviewed and are negative.   Physical Exam: Vitals:   08/27/19 1032  BP: 140/80  Pulse: 83  Temp: 97.8 F (36.6 C)   Body mass index is 27.62 kg/m. Gen:      No acute distress HEENT:  EOMI, sclera anicteric Neck:     No masses; no thyromegaly Lungs:    Clear to auscultation bilaterally; normal respiratory effort CV:         Regular rate and rhythm; no murmurs Abd:      + bowel sounds; soft, non-tender; no palpable masses, no distension Ext:    No edema; adequate peripheral perfusion Skin:      Warm and dry; no rash Neuro: alert and oriented x 3 Psych: normal mood and affect  Data Reviewed:  Reviewed labs, radiology imaging, old records and pertinent past GI work up   Assessment and Plan/Recommendations:  48 year old male with history of hypertension, thyroid disease, H. pylori infection status post treatment with eradication with complaints of generalized abdominal cramping and borborygmi Irritable bowel syndrome predominant diarrhea Reassured patient that green stool is not a concern Possible bile salt induced diarrhea: Continue Colestid 1 g twice daily Levsin 1 tablet up to 3 times daily as needed Trial of IBgard 1 capsule up  to 3 times daily as needed for IBS symptoms  Return in 6 months or sooner if needed   25 minutes was spent face-to-face with the patient. Greater than 50% of the time used for counseling as well as treatment plan and follow-up. He had multiple questions which were answered to his satisfaction  K. Denzil Magnuson , MD    CC: Sinda Du, MD

## 2019-08-31 ENCOUNTER — Encounter: Payer: Self-pay | Admitting: Gastroenterology

## 2019-09-01 DIAGNOSIS — I10 Essential (primary) hypertension: Secondary | ICD-10-CM | POA: Diagnosis not present

## 2019-09-01 DIAGNOSIS — K589 Irritable bowel syndrome without diarrhea: Secondary | ICD-10-CM | POA: Diagnosis not present

## 2019-09-01 DIAGNOSIS — D34 Benign neoplasm of thyroid gland: Secondary | ICD-10-CM | POA: Diagnosis not present

## 2019-09-01 DIAGNOSIS — K219 Gastro-esophageal reflux disease without esophagitis: Secondary | ICD-10-CM | POA: Diagnosis not present

## 2019-09-01 DIAGNOSIS — Z1331 Encounter for screening for depression: Secondary | ICD-10-CM | POA: Diagnosis not present

## 2019-09-01 DIAGNOSIS — Z8619 Personal history of other infectious and parasitic diseases: Secondary | ICD-10-CM | POA: Diagnosis not present

## 2019-09-06 ENCOUNTER — Telehealth: Payer: Self-pay | Admitting: Gastroenterology

## 2019-09-06 NOTE — Telephone Encounter (Signed)
Doctor of the day Patient with IBS-D recently seen by Dr Silverio Decamp and prescribed Levsin for his symptoms. He also takes Questran.  Reports Levsin does help with his "grumbling stomach." He has taken it twice a day, morning and evening. He is tolerating the "dry mouth" side effect. He started noticing "blurring of my vision" 2 days ago. He feels this is getting worse. He would like to have a different medication for his IBS-D to help "soothe" his stomach. Please advise.

## 2019-09-06 NOTE — Telephone Encounter (Signed)
Pt states that he hyoscyamine is giving him some side effects. He states that he has been experiencing blur vision. He wants to know if he can take something else.

## 2019-09-07 NOTE — Telephone Encounter (Signed)
I have reviewed the previous note.  -Stop omeprazole to see if the diarrhea gets better.  He has H. pylori which has been eradicated. -Hold off on Levsin for now. -Call us in 1 week if he is not better.  I believe the gallbladder is still in (no history of cholecystectomy).  If still with diarrhea, trial of Viberzi.  RG

## 2019-09-08 NOTE — Telephone Encounter (Signed)
Patient states understanding of the plan.

## 2019-09-08 NOTE — Patient Instructions (Addendum)
DUE TO COVID-19 ONLY ONE VISITOR IS ALLOWED TO COME WITH YOU AND STAY IN THE WAITING ROOM ONLY DURING PRE OP AND PROCEDURE DAY OF SURGERY. THE 1 VISITOR MAY VISIT WITH YOU AFTER SURGERY IN YOUR PRIVATE ROOM DURING VISITING HOURS ONLY!   YOUR COVID TEST IS COMPLETED, PLEASE BEGIN THE QUARANTINE INSTRUCTIONS AS OUTLINED IN YOUR HANDOUT.                Earl Hart   Your procedure is scheduled on: 12-3   Report to [place]  Entrance   Report to admitting at 6:30AM     Call this number if you have problems the morning of surgery [phone]    Remember: Do not eat food or drink liquids :After Midnight. BRUSH YOUR TEETH MORNING OF SURGERY AND RINSE YOUR MOUTH OUT, NO CHEWING GUM CANDY OR MINTS.     Take these medicines the morning of surgery with A SIP OF WATER: Amlodipine, Nebivolol                                 You may not have any metal on your body including hair pins and              piercings  Do not wear jewelry, make-up, lotions, powders or perfumes, deodorant                       Men may shave face and neck.    Do not bring valuables to the hospital. [place].  Contacts, dentures or bridgework may not be worn into surgery.      Patients discharged the day of surgery will not be allowed to drive home. IF YOU ARE HAVING SURGERY AND GOING HOME THE SAME DAY, YOU MUST HAVE AN ADULT TO DRIVE YOU HOME AND BE WITH YOU FOR 24 HOURS. YOU MAY GO HOME BY TAXI OR UBER OR ORTHERWISE, BUT AN ADULT MUST ACCOMPANY YOU HOME AND STAY WITH YOU FOR 24 HOURS.  Name and phone number of your driver:  Special Instructions: N/A              Please read over the following fact sheets you were given: _____________________________________________________________________             [facility] - Preparing for Surgery Before surgery, you can play an important role.  Because skin is not sterile, your skin needs to be as free  of germs as possible.  You can reduce the number of germs on your skin by washing with CHG (chlorahexidine gluconate) soap before surgery.  CHG is an antiseptic cleaner which kills germs and bonds with the skin to continue killing germs even after washing. Please DO NOT use if you have an allergy to CHG or antibacterial soaps.  If your skin becomes reddened/irritated stop using the CHG and inform your nurse when you arrive at Short Stay. Do not shave (including legs and underarms) for at least 48 hours prior to the first CHG shower.  You may shave your face/neck. Please follow these instructions carefully:  1.  Shower with CHG Soap the night before surgery and the  morning of Surgery.  2.  If you choose to wash your hair, wash your hair first as usual with your  normal  shampoo.  3.  After you shampoo, rinse your hair and body thoroughly to remove the  shampoo.                           4.  Use CHG as you would any other liquid soap.  You can apply chg directly  to the skin and wash                       Gently with a scrungie or clean washcloth.  5.  Apply the CHG Soap to your body ONLY FROM THE NECK DOWN.   Do not use on face/ open                           Wound or open sores. Avoid contact with eyes, ears mouth and genitals (private parts).                       Wash face,  Genitals (private parts) with your normal soap.             6.  Wash thoroughly, paying special attention to the area where your surgery  will be performed.  7.  Thoroughly rinse your body with warm water from the neck down.  8.  DO NOT shower/wash with your normal soap after using and rinsing off  the CHG Soap.                9.  Pat yourself dry with a clean towel.            10.  Wear clean pajamas.            11.  Place clean sheets on your bed the night of your first shower and do not  sleep with pets. Day of Surgery : Do not apply any lotions/deodorants the morning of surgery.  Please wear clean clothes to the  hospital/surgery center.  FAILURE TO FOLLOW THESE INSTRUCTIONS MAY RESULT IN THE CANCELLATION OF YOUR SURGERY PATIENT SIGNATURE_________________________________  NURSE SIGNATURE__________________________________  ________________________________________________________________________

## 2019-09-12 ENCOUNTER — Encounter (HOSPITAL_COMMUNITY): Payer: Self-pay | Admitting: Surgery

## 2019-09-12 DIAGNOSIS — E042 Nontoxic multinodular goiter: Secondary | ICD-10-CM | POA: Diagnosis present

## 2019-09-12 DIAGNOSIS — E049 Nontoxic goiter, unspecified: Secondary | ICD-10-CM | POA: Diagnosis present

## 2019-09-12 DIAGNOSIS — J398 Other specified diseases of upper respiratory tract: Secondary | ICD-10-CM | POA: Diagnosis present

## 2019-09-12 NOTE — H&P (Signed)
General Surgery [facility]  Earl Hart DOB: [birth year] Single / Language: [name] / Race: Black or African American Male   History of Present Illness   The patient is a 48 year old male who presents with a thyroid nodule.  CHIEF COMPLAINT: Enlarged thyroid, multiple thyroid nodules  Patient is referred by Dr. Jacelyn Pi for surgical evaluation and management of a large thyroid with multiple nodules and compressive symptoms. Patient's primary care physician is Dr. Velvet Bathe. Patient first noted an enlarged thyroid approximately 1 year ago when he developed right ear pain. He also noted some right sided throat pain and developed a globus sensation. Patient eventually underwent an ultrasound examination on January 27, 2019. This showed an enlarged thyroid gland with the right lobe measuring 8.4 cm and left lobe measuring 6.3 cm. In the right lobe is a dominant mass measuring 6.5 cm. This was felt to be mildly suspicious and fine-needle aspiration biopsy was obtained. This showed benign site of pathology consistent with a follicular nodule. In the left thyroid lobe were 2 subcentimeter nodules which were not worrisome. Patient has never been on thyroid medication. He denies any significant dysphagia. He did have an MRI scan for unrelated spine evaluation which did show an enlarged mass in the right side of the thyroid with mild tracheal and esophageal deviation towards the left. There is no family history of thyroid disease and no history of thyroid malignancy. Patient does have an essential tremor. He denies any outpatient. He presents today for consideration for thyroidectomy.   Past Surgical History No pertinent past surgical history   Diagnostic Studies History Colonoscopy  never  Allergies Penicillins  Allergies Reconciled   Medication History  amLODIPine Besylate (10MG  Tablet, Oral) Active. FLUoxetine HCl (90MG  Capsule DR, Oral)  Active. Bystolic (10MG  Tablet, Oral) Active. Medications Reconciled  Social History Caffeine use  Carbonated beverages, Tea. No alcohol use  No drug use  Tobacco use  Never smoker.  Family History Hypertension  Father, Mother.  Other Problems Anxiety Disorder  High blood pressure  Thyroid Disease   Review of Systems General Not Present- Appetite Loss, Chills, Fatigue, Fever, Night Sweats, Weight Gain and Weight Loss. Skin Not Present- Change in Wart/Mole, Dryness, Hives, Jaundice, New Lesions, Non-Healing Wounds, Rash and Ulcer. HEENT Not Present- Earache, Hearing Loss, Hoarseness, Nose Bleed, Oral Ulcers, Ringing in the Ears, Seasonal Allergies, Sinus Pain, Sore Throat, Visual Disturbances, Wears glasses/contact lenses and Yellow Eyes. Respiratory Not Present- Bloody sputum, Chronic Cough, Difficulty Breathing, Snoring and Wheezing. Breast Not Present- Breast Mass, Breast Pain, Nipple Discharge and Skin Changes. Cardiovascular Not Present- Chest Pain, Difficulty Breathing Lying Down, Leg Cramps, Palpitations, Rapid Heart Rate, Shortness of Breath and Swelling of Extremities. Gastrointestinal Not Present- Abdominal Pain, Bloating, Bloody Stool, Change in Bowel Habits, Chronic diarrhea, Constipation, Difficulty Swallowing, Excessive gas, Gets full quickly at meals, Hemorrhoids, Indigestion, Nausea, Rectal Pain and Vomiting. Male Genitourinary Not Present- Blood in Urine, Change in Urinary Stream, Frequency, Impotence, Nocturia, Painful Urination, Urgency and Urine Leakage. Musculoskeletal Not Present- Back Pain, Joint Pain, Joint Stiffness, Muscle Pain, Muscle Weakness and Swelling of Extremities. Neurological Not Present- Decreased Memory, Fainting, Headaches, Numbness, Seizures, Tingling, Tremor, Trouble walking and Weakness. Psychiatric Not Present- Anxiety, Bipolar, Change in Sleep Pattern, Depression, Fearful and Frequent crying. Endocrine Not Present- Cold Intolerance,  Excessive Hunger, Hair Changes, Heat Intolerance, Hot flashes and New Diabetes. Hematology Not Present- Blood Thinners, Easy Bruising, Excessive bleeding, Gland problems, HIV and Persistent Infections.  Vitals Weight:  201.4 lb Height: 71.5in Body Surface Area: 2.13 m Body Mass Index: 27.7 kg/m  Temp.: 97.73F  Pulse: 65 (Regular)  BP: 136/80 (Sitting, Left Arm, Standard)  Physical Exam  See vital signs recorded above  GENERAL APPEARANCE Development: normal Nutritional status: normal Gross deformities: none  SKIN Rash, lesions, ulcers: none Induration, erythema: none Nodules: none palpable  EYES Conjunctiva and lids: normal Pupils: equal and reactive Iris: normal bilaterally  EARS, NOSE, MOUTH, THROAT External ears: no lesion or deformity External nose: no lesion or deformity Hearing: grossly normal Patient is wearing a mask.  NECK Symmetric: no Trachea: Deviation to the left approximately 1 cm Thyroid: Palpation of the right thyroid lobe shows a large smooth relatively firm mass extending beneath the clavicle and resulting in mild deviation of the larynx towards the left. There is mild tenderness. Palpation on the left side reveals no enlargement of the left lobe and no dominant or discrete masses. There is mild tenderness to palpation. There does not appear to be any associated lymphadenopathy. Voice quality is normal.  CHEST Respiratory effort: normal Retraction or accessory muscle use: no Breath sounds: normal bilaterally Rales, rhonchi, wheeze: none  CARDIOVASCULAR Auscultation: regular rhythm, normal rate Murmurs: none Pulses: carotid and radial pulse 2+ palpable Lower extremity edema: none Lower extremity varicosities: none  MUSCULOSKELETAL Station and gait: normal Digits and nails: no clubbing or cyanosis Muscle strength: grossly normal all extremities Range of motion: grossly normal all extremities Deformity: none  LYMPHATIC Cervical:  none palpable Supraclavicular: none palpable  PSYCHIATRIC Oriented to person, place, and time: yes Mood and affect: normal for situation Judgment and insight: appropriate for situation    Assessment & Plan   ENLARGED THYROID (E04.9) MULTIPLE THYROID NODULES (E04.2) TRACHEAL DEVIATION (J39.8)  Patient is referred by his endocrinologist for surgical evaluation and management of an enlarged thyroid with multiple thyroid nodules and tracheal deviation. Patient is provided with written literature on thyroid surgery to review at home.  The patient and I discussed the options for management. We discussed total thyroidectomy versus thyroid lobectomy. We discussed the pros and cons of each procedure. We discussed the risk of recurrent laryngeal nerve injury and injury to parathyroid glands. We discussed the location and size of the surgical incision. We discussed the need for lifelong thyroid hormone replacement. We discussed the hospital stay and his postoperative recovery and return to work and activity. The patient understands and wishes to proceed with total thyroidectomy in the near future.  The risks and benefits of the procedure have been discussed at length with the patient. The patient understands the proposed procedure, potential alternative treatments, and the course of recovery to be expected. All of the patient's questions have been answered at this time. The patient wishes to proceed with surgery.  Armandina Gemma, [place] [facility] Office: [phone]

## 2019-09-13 ENCOUNTER — Other Ambulatory Visit (HOSPITAL_COMMUNITY): Payer: BC Managed Care – PPO

## 2019-09-13 ENCOUNTER — Other Ambulatory Visit (HOSPITAL_COMMUNITY)
Admission: RE | Admit: 2019-09-13 | Discharge: 2019-09-13 | Disposition: A | Discharge status: Home / Self Care | Payer: BC Managed Care – PPO | Source: Ambulatory Visit | Attending: Surgery | Admitting: Surgery

## 2019-09-13 DIAGNOSIS — Z01812 Encounter for preprocedural laboratory examination: Secondary | ICD-10-CM | POA: Insufficient documentation

## 2019-09-13 DIAGNOSIS — Z20828 Contact with and (suspected) exposure to other viral communicable diseases: Secondary | ICD-10-CM | POA: Diagnosis not present

## 2019-09-13 DIAGNOSIS — F332 Major depressive disorder, recurrent severe without psychotic features: Secondary | ICD-10-CM | POA: Diagnosis not present

## 2019-09-13 NOTE — Progress Notes (Signed)
Reviewed and agree with documentation and assessment and plan. K. Veena Orean Giarratano , MD   

## 2019-09-14 ENCOUNTER — Encounter (HOSPITAL_COMMUNITY): Payer: Self-pay

## 2019-09-14 ENCOUNTER — Ambulatory Visit (HOSPITAL_COMMUNITY)
Admission: RE | Admit: 2019-09-14 | Discharge: 2019-09-14 | Disposition: A | Discharge status: Home / Self Care | Payer: BC Managed Care – PPO | Source: Ambulatory Visit | Attending: Anesthesiology | Admitting: Anesthesiology

## 2019-09-14 ENCOUNTER — Other Ambulatory Visit: Payer: Self-pay

## 2019-09-14 ENCOUNTER — Encounter (HOSPITAL_COMMUNITY)
Admission: RE | Admit: 2019-09-14 | Discharge: 2019-09-14 | Disposition: A | Discharge status: Home / Self Care | Payer: BC Managed Care – PPO | Source: Ambulatory Visit | Attending: Surgery | Admitting: Surgery

## 2019-09-14 DIAGNOSIS — Z01811 Encounter for preprocedural respiratory examination: Secondary | ICD-10-CM | POA: Diagnosis not present

## 2019-09-14 DIAGNOSIS — Z01818 Encounter for other preprocedural examination: Secondary | ICD-10-CM | POA: Diagnosis not present

## 2019-09-14 HISTORY — DX: Nontoxic multinodular goiter: E04.2

## 2019-09-14 LAB — CBC
HCT: 47 % (ref 39.0–52.0)
Hemoglobin: 14.8 g/dL (ref 13.0–17.0)
MCH: 26.8 pg (ref 26.0–34.0)
MCHC: 31.5 g/dL (ref 30.0–36.0)
MCV: 85.1 fL (ref 80.0–100.0)
Platelets: 209 10*3/uL (ref 150–400)
RBC: 5.52 MIL/uL (ref 4.22–5.81)
RDW: 13.7 % (ref 11.5–15.5)
WBC: 7.1 10*3/uL (ref 4.0–10.5)
nRBC: 0 % (ref 0.0–0.2)

## 2019-09-14 LAB — BASIC METABOLIC PANEL
Anion gap: 7 (ref 5–15)
BUN: 25 mg/dL — ABNORMAL HIGH (ref 6–20)
CO2: 26 mmol/L (ref 22–32)
Calcium: 9.2 mg/dL (ref 8.9–10.3)
Chloride: 105 mmol/L (ref 98–111)
Creatinine, Ser: 0.91 mg/dL (ref 0.61–1.24)
GFR calc Af Amer: >60 mL/min (ref >60)
GFR calc non Af Amer: >60 mL/min (ref >60)
Glucose, Bld: 118 mg/dL — ABNORMAL HIGH (ref 70–99)
Potassium: 4 mmol/L (ref 3.5–5.1)
Sodium: 138 mmol/L (ref 135–145)

## 2019-09-14 LAB — NOVEL CORONAVIRUS, NAA (HOSP ORDER, SEND-OUT TO REF LAB; TAT 18-24 HRS): SARS-CoV-2, NAA: NOT DETECTED

## 2019-09-14 NOTE — Progress Notes (Addendum)
PCP - Tisovec, Fransico Him, MD Cardiologist -   Chest x-ray - 12-1 epic  EKG - 12-1 epic  Stress Test -  ECHO -  Cardiac Cath -   Sleep Study -  CPAP -   Fasting Blood Sugar -  Checks Blood Sugar _____ times a day  Blood Thinner Instructions: Aspirin Instructions: Last Dose:  Anesthesia review:   Patient denies dyspnea, dysphagia presently, and reports no change in condition since last office visit with his surgeon . No hoarseness of voice observed at time of pre-op appointment. RR 18 , 02 saturation 100%.   Chart to PA for review of  EKG and CXR results.    Patient denies shortness of breath, fever, cough and chest pain at PAT appointment   Patient verbalized understanding of instructions that were given to them at the PAT appointment. Patient was also instructed that they will need to review over the PAT instructions again at home before surgery.

## 2019-09-16 ENCOUNTER — Other Ambulatory Visit: Payer: Self-pay

## 2019-09-16 ENCOUNTER — Encounter (HOSPITAL_COMMUNITY)
Admission: RE | Disposition: A | Discharge status: Home / Self Care | Payer: Self-pay | Source: Home / Self Care | Attending: Surgery

## 2019-09-16 ENCOUNTER — Ambulatory Visit (HOSPITAL_COMMUNITY): Payer: BC Managed Care – PPO | Admitting: Physician Assistant

## 2019-09-16 ENCOUNTER — Encounter (HOSPITAL_COMMUNITY): Payer: Self-pay | Admitting: *Deleted

## 2019-09-16 ENCOUNTER — Ambulatory Visit (HOSPITAL_COMMUNITY): Payer: BC Managed Care – PPO | Admitting: Certified Registered"

## 2019-09-16 ENCOUNTER — Observation Stay (HOSPITAL_COMMUNITY)
Admission: RE | Admit: 2019-09-16 | Discharge: 2019-09-17 | Disposition: A | Discharge status: Home / Self Care | Payer: BC Managed Care – PPO | Attending: Surgery | Admitting: Surgery

## 2019-09-16 DIAGNOSIS — F419 Anxiety disorder, unspecified: Secondary | ICD-10-CM | POA: Insufficient documentation

## 2019-09-16 DIAGNOSIS — F458 Other somatoform disorders: Secondary | ICD-10-CM | POA: Diagnosis not present

## 2019-09-16 DIAGNOSIS — I10 Essential (primary) hypertension: Secondary | ICD-10-CM | POA: Insufficient documentation

## 2019-09-16 DIAGNOSIS — G250 Essential tremor: Secondary | ICD-10-CM | POA: Diagnosis not present

## 2019-09-16 DIAGNOSIS — E042 Nontoxic multinodular goiter: Secondary | ICD-10-CM | POA: Diagnosis not present

## 2019-09-16 DIAGNOSIS — Z880 Allergy status to penicillin: Secondary | ICD-10-CM | POA: Insufficient documentation

## 2019-09-16 DIAGNOSIS — J398 Other specified diseases of upper respiratory tract: Secondary | ICD-10-CM | POA: Diagnosis not present

## 2019-09-16 DIAGNOSIS — E049 Nontoxic goiter, unspecified: Secondary | ICD-10-CM | POA: Diagnosis not present

## 2019-09-16 DIAGNOSIS — Z79899 Other long term (current) drug therapy: Secondary | ICD-10-CM | POA: Insufficient documentation

## 2019-09-16 DIAGNOSIS — Z8249 Family history of ischemic heart disease and other diseases of the circulatory system: Secondary | ICD-10-CM | POA: Insufficient documentation

## 2019-09-16 HISTORY — PX: THYROIDECTOMY: SHX17

## 2019-09-16 SURGERY — THYROIDECTOMY
Anesthesia: General | Site: Neck

## 2019-09-16 MED ORDER — MIDAZOLAM HCL 2 MG/2ML IJ SOLN
INTRAMUSCULAR | Status: AC
  Filled 2019-09-16: qty 2

## 2019-09-16 MED ORDER — ACETAMINOPHEN 10 MG/ML IV SOLN
INTRAVENOUS | Status: AC
  Filled 2019-09-16: qty 100

## 2019-09-16 MED ORDER — PROPOFOL 10 MG/ML IV BOLUS
INTRAVENOUS | Status: DC | PRN
  Administered 2019-09-16 (×2): 30 mg via INTRAVENOUS
  Administered 2019-09-16: 140 mg via INTRAVENOUS

## 2019-09-16 MED ORDER — OXYCODONE HCL 5 MG PO TABS: 5.0000 mg | ORAL_TABLET | Freq: Once | ORAL | Status: DC | PRN

## 2019-09-16 MED ORDER — HYDROMORPHONE HCL 1 MG/ML IJ SOLN
INTRAMUSCULAR | Status: AC
  Administered 2019-09-16: 0.5 mg via INTRAVENOUS
  Filled 2019-09-16: qty 1

## 2019-09-16 MED ORDER — LACTATED RINGERS IV SOLN: INTRAVENOUS | Status: DC

## 2019-09-16 MED ORDER — KCL IN DEXTROSE-NACL 20-5-0.45 MEQ/L-%-% IV SOLN
INTRAVENOUS | Status: DC
  Administered 2019-09-16: 17:00:00 via INTRAVENOUS
  Filled 2019-09-16: qty 1000

## 2019-09-16 MED ORDER — HYDROMORPHONE HCL 1 MG/ML IJ SOLN
0.2500 mg | INTRAMUSCULAR | Status: DC | PRN
  Administered 2019-09-16 (×3): 0.5 mg via INTRAVENOUS

## 2019-09-16 MED ORDER — MICROFIBRILLAR COLL HEMOSTAT EX PADS
MEDICATED_PAD | CUTANEOUS | Status: DC | PRN
  Administered 2019-09-16: 1 via TOPICAL

## 2019-09-16 MED ORDER — LACTATED RINGERS IV SOLN
INTRAVENOUS | Status: DC
  Administered 2019-09-16 (×2): via INTRAVENOUS

## 2019-09-16 MED ORDER — ROCURONIUM BROMIDE 10 MG/ML (PF) SYRINGE
PREFILLED_SYRINGE | INTRAVENOUS | Status: AC
  Filled 2019-09-16: qty 10

## 2019-09-16 MED ORDER — LIDOCAINE 2% (20 MG/ML) 5 ML SYRINGE
INTRAMUSCULAR | Status: DC | PRN
  Administered 2019-09-16: 50 mg via INTRAVENOUS

## 2019-09-16 MED ORDER — CHLORHEXIDINE GLUCONATE CLOTH 2 % EX PADS: 6.0000 | MEDICATED_PAD | Freq: Once | CUTANEOUS | Status: DC

## 2019-09-16 MED ORDER — PHENYLEPHRINE 40 MCG/ML (10ML) SYRINGE FOR IV PUSH (FOR BLOOD PRESSURE SUPPORT)
PREFILLED_SYRINGE | INTRAVENOUS | Status: AC
  Filled 2019-09-16: qty 10

## 2019-09-16 MED ORDER — ONDANSETRON 4 MG PO TBDP: 4.0000 mg | ORAL_TABLET | Freq: Four times a day (QID) | ORAL | Status: DC | PRN

## 2019-09-16 MED ORDER — FENTANYL CITRATE (PF) 250 MCG/5ML IJ SOLN
INTRAMUSCULAR | Status: DC | PRN
  Administered 2019-09-16: 50 ug via INTRAVENOUS
  Administered 2019-09-16: 75 ug via INTRAVENOUS

## 2019-09-16 MED ORDER — TRAMADOL HCL 50 MG PO TABS: 50.0000 mg | ORAL_TABLET | Freq: Four times a day (QID) | ORAL | Status: DC | PRN

## 2019-09-16 MED ORDER — CALCIUM CARBONATE 1250 (500 CA) MG PO TABS
2.0000 | ORAL_TABLET | Freq: Three times a day (TID) | ORAL | Status: DC
  Administered 2019-09-16 – 2019-09-17 (×2): 1000 mg via ORAL
  Filled 2019-09-16 (×2): qty 1

## 2019-09-16 MED ORDER — LIDOCAINE 2% (20 MG/ML) 5 ML SYRINGE
INTRAMUSCULAR | Status: AC
  Filled 2019-09-16: qty 5

## 2019-09-16 MED ORDER — PROPOFOL 10 MG/ML IV BOLUS
INTRAVENOUS | Status: AC
  Filled 2019-09-16: qty 40

## 2019-09-16 MED ORDER — SUCCINYLCHOLINE CHLORIDE 200 MG/10ML IV SOSY
PREFILLED_SYRINGE | INTRAVENOUS | Status: AC
  Filled 2019-09-16: qty 10

## 2019-09-16 MED ORDER — ACETAMINOPHEN 10 MG/ML IV SOLN
1000.0000 mg | Freq: Once | INTRAVENOUS | Status: DC | PRN
  Administered 2019-09-16: 1000 mg via INTRAVENOUS

## 2019-09-16 MED ORDER — DEXAMETHASONE SODIUM PHOSPHATE 10 MG/ML IJ SOLN
INTRAMUSCULAR | Status: AC
  Filled 2019-09-16: qty 1

## 2019-09-16 MED ORDER — NEBIVOLOL HCL 10 MG PO TABS
10.0000 mg | ORAL_TABLET | Freq: Every day | ORAL | Status: DC
  Administered 2019-09-17: 10 mg via ORAL
  Filled 2019-09-16: qty 1

## 2019-09-16 MED ORDER — OXYCODONE HCL 5 MG/5ML PO SOLN: 5.0000 mg | Freq: Once | ORAL | Status: DC | PRN

## 2019-09-16 MED ORDER — SUGAMMADEX SODIUM 200 MG/2ML IV SOLN
INTRAVENOUS | Status: DC | PRN
  Administered 2019-09-16: 200 mg via INTRAVENOUS

## 2019-09-16 MED ORDER — ONDANSETRON HCL 4 MG/2ML IJ SOLN
INTRAMUSCULAR | Status: AC
  Filled 2019-09-16: qty 2

## 2019-09-16 MED ORDER — ACETAMINOPHEN 325 MG PO TABS
650.0000 mg | ORAL_TABLET | Freq: Four times a day (QID) | ORAL | Status: DC | PRN
  Administered 2019-09-16 – 2019-09-17 (×3): 650 mg via ORAL
  Filled 2019-09-16 (×3): qty 2

## 2019-09-16 MED ORDER — FENTANYL CITRATE (PF) 250 MCG/5ML IJ SOLN
INTRAMUSCULAR | Status: AC
  Filled 2019-09-16: qty 5

## 2019-09-16 MED ORDER — ROCURONIUM BROMIDE 10 MG/ML (PF) SYRINGE
PREFILLED_SYRINGE | INTRAVENOUS | Status: DC | PRN
  Administered 2019-09-16: 20 mg via INTRAVENOUS
  Administered 2019-09-16: 10 mg via INTRAVENOUS
  Administered 2019-09-16: 50 mg via INTRAVENOUS

## 2019-09-16 MED ORDER — PROMETHAZINE HCL 25 MG/ML IJ SOLN: 6.2500 mg | INTRAMUSCULAR | Status: DC | PRN

## 2019-09-16 MED ORDER — PHENYLEPHRINE 40 MCG/ML (10ML) SYRINGE FOR IV PUSH (FOR BLOOD PRESSURE SUPPORT)
PREFILLED_SYRINGE | INTRAVENOUS | Status: DC | PRN
  Administered 2019-09-16: 80 ug via INTRAVENOUS
  Administered 2019-09-16: 40 ug via INTRAVENOUS

## 2019-09-16 MED ORDER — EPHEDRINE SULFATE-NACL 50-0.9 MG/10ML-% IV SOSY
PREFILLED_SYRINGE | INTRAVENOUS | Status: DC | PRN
  Administered 2019-09-16: 10 mg via INTRAVENOUS

## 2019-09-16 MED ORDER — HYDROMORPHONE HCL 1 MG/ML IJ SOLN: 1.0000 mg | INTRAMUSCULAR | Status: DC | PRN

## 2019-09-16 MED ORDER — MEPERIDINE HCL 50 MG/ML IJ SOLN: 6.2500 mg | INTRAMUSCULAR | Status: DC | PRN

## 2019-09-16 MED ORDER — ONDANSETRON HCL 4 MG/2ML IJ SOLN: 4.0000 mg | Freq: Four times a day (QID) | INTRAMUSCULAR | Status: DC | PRN

## 2019-09-16 MED ORDER — OXYCODONE HCL 5 MG PO TABS
5.0000 mg | ORAL_TABLET | ORAL | Status: DC | PRN
  Filled 2019-09-16: qty 2

## 2019-09-16 MED ORDER — 0.9 % SODIUM CHLORIDE (POUR BTL) OPTIME
TOPICAL | Status: DC | PRN
  Administered 2019-09-16: 1000 mL

## 2019-09-16 MED ORDER — MIDAZOLAM HCL 2 MG/2ML IJ SOLN
INTRAMUSCULAR | Status: DC | PRN
  Administered 2019-09-16: 2 mg via INTRAVENOUS

## 2019-09-16 MED ORDER — ACETAMINOPHEN 325 MG PO TABS: 325.0000 mg | ORAL_TABLET | Freq: Once | ORAL | Status: DC | PRN

## 2019-09-16 MED ORDER — ACETAMINOPHEN 650 MG RE SUPP: 650.0000 mg | Freq: Four times a day (QID) | RECTAL | Status: DC | PRN

## 2019-09-16 MED ORDER — CIPROFLOXACIN IN D5W 400 MG/200ML IV SOLN
400.0000 mg | INTRAVENOUS | Status: AC
  Administered 2019-09-16: 09:00:00 400 mg via INTRAVENOUS
  Filled 2019-09-16: qty 200

## 2019-09-16 MED ORDER — ACETAMINOPHEN 160 MG/5ML PO SOLN: 325.0000 mg | Freq: Once | ORAL | Status: DC | PRN

## 2019-09-16 MED ORDER — STERILE WATER FOR IRRIGATION IR SOLN
Status: DC | PRN
  Administered 2019-09-16: 1000 mL

## 2019-09-16 MED ORDER — AMLODIPINE BESYLATE 10 MG PO TABS
10.0000 mg | ORAL_TABLET | Freq: Every day | ORAL | Status: DC
  Administered 2019-09-17: 10 mg via ORAL
  Filled 2019-09-16: qty 1

## 2019-09-16 MED ORDER — DEXAMETHASONE SODIUM PHOSPHATE 10 MG/ML IJ SOLN
INTRAMUSCULAR | Status: DC | PRN
  Administered 2019-09-16: 10 mg via INTRAVENOUS

## 2019-09-16 SURGICAL SUPPLY — 30 items
ADH SKN CLS APL DERMABOND .7 (GAUZE/BANDAGES/DRESSINGS) ×1
APL PRP STRL LF DISP 70% ISPRP (MISCELLANEOUS) ×2
ATTRACTOMAT 16X20 MAGNETIC DRP (DRAPES) ×2 IMPLANT
BLADE SURG 15 STRL LF DISP TIS (BLADE) ×1 IMPLANT
BLADE SURG 15 STRL SS (BLADE) ×2
CHLORAPREP W/TINT 26 (MISCELLANEOUS) ×4 IMPLANT
CLIP VESOCCLUDE MED 6/CT (CLIP) ×4 IMPLANT
CLIP VESOCCLUDE SM WIDE 6/CT (CLIP) ×4 IMPLANT
COVER SURGICAL LIGHT HANDLE (MISCELLANEOUS) ×2 IMPLANT
COVER WAND RF STERILE (DRAPES) ×2 IMPLANT
DERMABOND ADVANCED (GAUZE/BANDAGES/DRESSINGS) ×1
DERMABOND ADVANCED .7 DNX12 (GAUZE/BANDAGES/DRESSINGS) IMPLANT
DRAPE LAPAROTOMY T 98X78 PEDS (DRAPES) ×2 IMPLANT
ELECT REM PT RETURN 15FT ADLT (MISCELLANEOUS) ×2 IMPLANT
GAUZE 4X4 16PLY RFD (DISPOSABLE) ×2 IMPLANT
GLOVE SURG ORTHO 8.0 STRL STRW (GLOVE) ×2 IMPLANT
GOWN STRL REUS W/TWL XL LVL3 (GOWN DISPOSABLE) ×4 IMPLANT
HEMOSTAT SURGICEL 2X4 FIBR (HEMOSTASIS) ×1 IMPLANT
ILLUMINATOR WAVEGUIDE N/F (MISCELLANEOUS) ×1 IMPLANT
KIT BASIN OR (CUSTOM PROCEDURE TRAY) ×2 IMPLANT
KIT TURNOVER KIT A (KITS) IMPLANT
PACK BASIC VI WITH GOWN DISP (CUSTOM PROCEDURE TRAY) ×2 IMPLANT
PENCIL SMOKE EVACUATOR (MISCELLANEOUS) ×1 IMPLANT
SHEARS HARMONIC 9CM CVD (BLADE) ×2 IMPLANT
SUT MNCRL AB 4-0 PS2 18 (SUTURE) ×2 IMPLANT
SUT VIC AB 3-0 SH 18 (SUTURE) ×4 IMPLANT
SYR BULB IRRIGATION 50ML (SYRINGE) ×2 IMPLANT
TOWEL OR 17X26 10 PK STRL BLUE (TOWEL DISPOSABLE) ×2 IMPLANT
TOWEL OR NON WOVEN STRL DISP B (DISPOSABLE) ×2 IMPLANT
TUBING CONNECTING 10 (TUBING) ×2 IMPLANT

## 2019-09-16 NOTE — Transfer of Care (Signed)
Immediate Anesthesia Transfer of Care Note  Patient: Earl Hart  Procedure(s) Performed: TOTAL THYROIDECTOMY (N/A Neck)  Patient Location: PACU  Anesthesia Type:General  Level of Consciousness: sedated and responds to stimulation  Airway & Oxygen Therapy: Patient Spontanous Breathing and Patient connected to face mask oxygen  Post-op Assessment: Report given to RN and Post -op Vital signs reviewed and stable  Post vital signs: stable  Last Vitals:  Vitals Value Taken Time  BP 138/84 09/16/19 1032  Temp    Pulse 57 09/16/19 1033  Resp    SpO2 100 % 09/16/19 1033  Vitals shown include unvalidated device data.  Last Pain:  Vitals:   09/16/19 0637  TempSrc: Oral      Patients Stated Pain Goal: 3 ([id number])  Complications: No apparent anesthesia complications

## 2019-09-16 NOTE — Anesthesia Procedure Notes (Signed)
Procedure Name: Intubation Date/Time: 09/16/2019 8:41 AM Performed by: Pilar Grammes, CRNA Pre-anesthesia Checklist: Patient identified, Emergency Drugs available, Suction available, Patient being monitored and Timeout performed Patient Re-evaluated:Patient Re-evaluated prior to induction Oxygen Delivery Method: Circle system utilized Preoxygenation: Pre-oxygenation with 100% oxygen Induction Type: IV induction Ventilation: Mask ventilation without difficulty Laryngoscope Size: Miller and 2 Grade View: Grade II Tube type: Oral Tube size: 7.5 mm Number of attempts: 1 Airway Equipment and Method: Stylet Placement Confirmation: positive ETCO2,  ETT inserted through vocal cords under direct vision,  CO2 detector and breath sounds checked- equal and bilateral Secured at: 23 cm Tube secured with: Tape Dental Injury: Teeth and Oropharynx as per pre-operative assessment

## 2019-09-16 NOTE — Plan of Care (Signed)
Patient arrived via stretcher from PACU; transferred from stretcher to bed with no assist. Pain controlled at this time. No needs expressed. Will continue to monitor.

## 2019-09-16 NOTE — Interval H&P Note (Signed)
History and Physical Interval Note:  09/16/2019 8:10 AM  Earl Hart  has presented today for surgery, with the diagnosis of enlarged thyroid, multiple thyroid nodules, tracheal deviation.  The various methods of treatment have been discussed with the patient and family. After consideration of risks, benefits and other options for treatment, the patient has consented to    Procedure(s): TOTAL THYROIDECTOMY (N/A) as a surgical intervention.    The patient's history has been reviewed, patient examined, no change in status, stable for surgery.  I have reviewed the patient's chart and labs.  Questions were answered to the patient's satisfaction.    Armandina Gemma, MD [facility] Office: [place]

## 2019-09-16 NOTE — Anesthesia Preprocedure Evaluation (Addendum)
Anesthesia Evaluation  Patient identified by MRN, date of birth, ID band Patient awake    Reviewed: Allergy & Precautions, NPO status , Patient's Chart, lab work & pertinent test results  Airway Mallampati: III  TM Distance: >3 FB Neck ROM: Full    Dental  (+) Missing,    Pulmonary neg pulmonary ROS,    breath sounds clear to auscultation       Cardiovascular hypertension,  Rhythm:Regular Rate:Normal     Neuro/Psych Anxiety negative neurological ROS     GI/Hepatic negative GI ROS, Neg liver ROS,   Endo/Other  negative endocrine ROS  Renal/GU negative Renal ROS     Musculoskeletal negative musculoskeletal ROS (+)   Abdominal Normal abdominal exam  (+)   Peds  Hematology negative hematology ROS (+)   Anesthesia Other Findings   Reproductive/Obstetrics                            Anesthesia Physical Anesthesia Plan  ASA: II  Anesthesia Plan: General   Post-op Pain Management:    Induction: Intravenous  PONV Risk Score and Plan: 3 and Ondansetron, Dexamethasone and Midazolam  Airway Management Planned: Oral ETT  Additional Equipment: None  Intra-op Plan:   Post-operative Plan: Extubation in OR  Informed Consent: I have reviewed the patients History and Physical, chart, labs and discussed the procedure including the risks, benefits and alternatives for the proposed anesthesia with the patient or authorized representative who has indicated his/her understanding and acceptance.     Dental advisory given  Plan Discussed with: CRNA  Anesthesia Plan Comments:         Anesthesia Quick Evaluation

## 2019-09-16 NOTE — Op Note (Signed)
Procedure Note  Pre-operative Diagnosis:  Enlarged thyroid, multiple thyroid nodules, tracheal deviation  Post-operative Diagnosis:  same  Surgeon:  Armandina Gemma, MD  Assistant:  none   Procedure:  Total thyroidectomy  Anesthesia:  General  Estimated Blood Loss:  minimal  Drains: none         Specimen: thyroid to pathology  Indications:  Patient is referred for surgical evaluation and management of an enlarged thyroid with multiple nodules and compressive symptoms. Patient first noted an enlarged thyroid approximately 1 year ago when he developed right ear pain. He also noted some right sided throat pain and developed a globus sensation. Patient eventually underwent an ultrasound examination on January 27, 2019. This showed an enlarged thyroid gland with the right lobe measuring 8.4 cm and left lobe measuring 6.3 cm. In the right lobe is a dominant mass measuring 6.5 cm. This was felt to be mildly suspicious and fine-needle aspiration biopsy was obtained. This showed benign site of pathology consistent with a follicular nodule. In the left thyroid lobe were 2 subcentimeter nodules which were not worrisome. Patient has never been on thyroid medication. He denies any significant dysphagia. He did have an MRI scan for unrelated spine evaluation which did show an enlarged mass in the right side of the thyroid with mild tracheal and esophageal deviation towards the left. There is no family history of thyroid disease and no history of thyroid malignancy. Patient does have an essential tremor. He presents today for thyroidectomy.  Procedure Details: Procedure was done in OR #4 at the [facility]. The patient was brought to the operating room and placed in a supine position on the operating room table. Following administration of general anesthesia, the patient was positioned and then prepped and draped in the usual aseptic fashion. After ascertaining that an adequate level of  anesthesia had been achieved, a small Kocher incision was made with #15 blade. Dissection was carried through subcutaneous tissues and platysma.Hemostasis was achieved with the electrocautery. Skin flaps were elevated cephalad and caudad from the thyroid notch to the sternal notch. A Mahorner self-retaining retractor was placed for exposure. Strap muscles were incised in the midline and dissection was begun on the left side.  Strap muscles were reflected laterally.  Left thyroid lobe was mildly enlarged with small nodules.  The left lobe was gently mobilized with blunt dissection. Superior pole vessels were dissected out and divided individually between small and medium ligaclips with the harmonic scalpel. The thyroid lobe was rolled anteriorly. Branches of the inferior thyroid artery were divided between small ligaclips with the harmonic scalpel. Inferior venous tributaries were divided between ligaclips. Both the superior and inferior parathyroid glands were identified and preserved on their vascular pedicles. The recurrent laryngeal nerve was identified and preserved along its course. The ligament of Gwenlyn Found was released with the electrocautery and the gland was mobilized onto the anterior trachea. Isthmus was mobilized across the midline. There was no pyramidal lobe present. Dry pack was placed in the left neck.  The right thyroid lobe was gently mobilized with blunt dissection. Right thyroid lobe was markedly enlarged with a dominant mass located centrally and a large nodule in the isthmus. Superior pole vessels were dissected out and divided between small and medium ligaclips with the Harmonic scalpel. Superior parathyroid was identified and preserved. Inferior venous tributaries were divided between medium ligaclips with the harmonic scalpel. The right thyroid lobe was rolled anteriorly and the branches of the inferior thyroid artery divided between small ligaclips. The  right recurrent laryngeal nerve was  identified and preserved along its course. The ligament of Gwenlyn Found was released with the electrocautery. The right thyroid lobe was mobilized onto the anterior trachea and the remainder of the thyroid was dissected off the anterior trachea and the thyroid was completely excised. A suture was used to mark the right lobe. The entire thyroid gland was submitted to pathology for review.  The neck was irrigated with warm saline. Fibrillar was placed throughout the operative field. Strap muscles were approximated in the midline with interrupted 3-0 Vicryl sutures. Platysma was closed with interrupted 3-0 Vicryl sutures. Skin was closed with a running 4-0 Monocryl subcuticular suture. Wound was washed and Dermabond was applied. The patient was awakened from anesthesia and brought to the recovery room. The patient tolerated the procedure well.   Armandina Gemma, MD [facility] Office: [phone]

## 2019-09-17 ENCOUNTER — Encounter (HOSPITAL_COMMUNITY): Payer: Self-pay | Admitting: Surgery

## 2019-09-17 DIAGNOSIS — F419 Anxiety disorder, unspecified: Secondary | ICD-10-CM | POA: Diagnosis not present

## 2019-09-17 DIAGNOSIS — E042 Nontoxic multinodular goiter: Secondary | ICD-10-CM | POA: Diagnosis not present

## 2019-09-17 DIAGNOSIS — E049 Nontoxic goiter, unspecified: Secondary | ICD-10-CM | POA: Diagnosis not present

## 2019-09-17 DIAGNOSIS — F458 Other somatoform disorders: Secondary | ICD-10-CM | POA: Diagnosis not present

## 2019-09-17 DIAGNOSIS — Z79899 Other long term (current) drug therapy: Secondary | ICD-10-CM | POA: Diagnosis not present

## 2019-09-17 DIAGNOSIS — J398 Other specified diseases of upper respiratory tract: Secondary | ICD-10-CM | POA: Diagnosis not present

## 2019-09-17 DIAGNOSIS — I10 Essential (primary) hypertension: Secondary | ICD-10-CM | POA: Diagnosis not present

## 2019-09-17 DIAGNOSIS — Z880 Allergy status to penicillin: Secondary | ICD-10-CM | POA: Diagnosis not present

## 2019-09-17 DIAGNOSIS — G250 Essential tremor: Secondary | ICD-10-CM | POA: Diagnosis not present

## 2019-09-17 DIAGNOSIS — Z8249 Family history of ischemic heart disease and other diseases of the circulatory system: Secondary | ICD-10-CM | POA: Diagnosis not present

## 2019-09-17 LAB — BASIC METABOLIC PANEL
Anion gap: 9 (ref 5–15)
BUN: 16 mg/dL (ref 6–20)
CO2: 27 mmol/L (ref 22–32)
Calcium: 8.9 mg/dL (ref 8.9–10.3)
Chloride: 101 mmol/L (ref 98–111)
Creatinine, Ser: 0.86 mg/dL (ref 0.61–1.24)
GFR calc Af Amer: >60 mL/min (ref >60)
GFR calc non Af Amer: >60 mL/min (ref >60)
Glucose, Bld: 106 mg/dL — ABNORMAL HIGH (ref 70–99)
Potassium: 4 mmol/L (ref 3.5–5.1)
Sodium: 137 mmol/L (ref 135–145)

## 2019-09-17 MED ORDER — CALCIUM CARBONATE ANTACID 500 MG PO CHEW: 2.0000 | CHEWABLE_TABLET | Freq: Two times a day (BID) | ORAL | 1 refills | Status: DC

## 2019-09-17 MED ORDER — LEVOTHYROXINE SODIUM 125 MCG PO TABS: 125.0000 ug | ORAL_TABLET | Freq: Every day | ORAL | 2 refills | Status: AC

## 2019-09-17 MED ORDER — TRAMADOL HCL 50 MG PO TABS: 50.0000 mg | ORAL_TABLET | Freq: Four times a day (QID) | ORAL | 0 refills | Status: DC | PRN

## 2019-09-17 NOTE — Discharge Summary (Signed)
Physician Discharge Summary [facility]  Patient ID: Earl Hart MRN: [id number] DOB/AGE: [birth year] 48 y.o.  Admit date: 09/16/2019 Discharge date: 09/17/2019  Admission Diagnoses:  Enlarged thyroid, multiple thyroid nodules  Discharge Diagnoses:  Principal Problem:   Enlarged thyroid Active Problems:   Multiple thyroid nodules   Tracheal deviation   Discharged Condition: good  Hospital Course: Patient was admitted for observation following thyroid surgery.  Post op course was uncomplicated.  Pain was well controlled.  Tolerated diet.  Post op calcium level on morning following surgery was 8.9 mg/dl.  Patient was prepared for discharge home on POD#1.  Consults: None  Treatments: surgery: total thyroidectomy  Discharge Exam: Blood pressure 136/85, pulse 61, temperature 99 F (37.2 C), temperature source Oral, resp. rate 15, height 5\' 11"  (1.803 m), weight 89.4 kg, SpO2 100 %. HEENT - clear Neck - wound dry and intact; mild STS; voice normal; Dermabond in place Chest - clear bilaterally Cor - RRR  Disposition: Home  Discharge Instructions    Diet - low sodium heart healthy   Complete by: As directed    Discharge instructions   Complete by: As directed    [facility]  THYROID & PARATHYROID SURGERY:  POST-OP INSTRUCTIONS  Always review your discharge instruction sheet from the facility where your surgery was performed.  A prescription for pain medication may be given to you upon discharge.  Take your pain medication as prescribed.  If narcotic pain medicine is not needed, then you may take acetaminophen (Tylenol) or ibuprofen (Advil) as needed.  Take your usually prescribed medications unless otherwise directed.  If you need a refill on your pain medication, please contact our office during regular business hours.  Prescriptions cannot be processed by our office after 5 pm or on weekends.  Start with a light diet upon  arrival home, such as soup and crackers or toast.  Be sure to drink plenty of fluids daily.  Resume your normal diet the day after surgery.  Most patients will experience some swelling and bruising on the chest and neck area.  Ice packs will help.  Swelling and bruising can take several days to resolve.   It is common to experience some constipation after surgery.  Increasing fluid intake and taking a stool softener (Colace) will usually help or prevent this problem.  A mild laxative (Milk of Magnesia or Miralax) should be taken according to package directions if there has been no bowel movement after 48 hours.  You have steri-strips and a gauze dressing over your incision.  You may remove the gauze bandage on the second day after surgery, and you may shower at that time.  Leave your steri-strips (small skin tapes) in place directly over the incision.  These strips should remain on the skin for 5-7 days and then be removed.  You may get them wet in the shower and pat them dry.  You may resume regular (light) daily activities beginning the next day (such as daily self-care, walking, climbing stairs) gradually increasing activities as tolerated.  You may have sexual intercourse when it is comfortable.  Refrain from any heavy lifting or straining until approved by your doctor.  You may drive when you no longer are taking prescription pain medication, you can comfortably wear a seatbelt, and you can safely maneuver your car and apply brakes.  You should see your doctor in the office for a follow-up appointment approximately three weeks after your surgery.  Make sure that you call for this appointment within a day or two after you arrive home to insure a convenient appointment time.  WHEN TO CALL YOUR DOCTOR: -- Fever greater than 101.5 -- Inability to urinate -- Nausea and/or vomiting - persistent -- Extreme swelling or bruising -- Continued bleeding from incision -- Increased pain, redness, or  drainage from the incision -- Difficulty swallowing or breathing -- Muscle cramping or spasms -- Numbness or tingling in hands or around lips  The clinic staff is available to answer your questions during regular business hours.  Please don't hesitate to call and ask to speak to one of the nurses if you have concerns.  Armandina Gemma, MD [facility] Office: [phone]   Increase activity slowly   Complete by: As directed    No dressing needed   Complete by: As directed      Allergies as of 09/17/2019      Reactions   Penicillins Rash   Did it involve swelling of the face/tongue/throat, SOB, or low BP? No Did it involve sudden or severe rash/hives, skin peeling, or any reaction on the inside of your mouth or nose? No Did you need to seek medical attention at a hospital or doctor's office? Yes When did it last happen?in your 30s If all above answers are "NO", may proceed with cephalosporin use.      Medication List    TAKE these medications   amLODipine 10 MG tablet Commonly known as: NORVASC Take 10 mg by mouth daily.   calcium carbonate 500 MG chewable tablet Commonly known as: Tums Chew 2 tablets (400 mg of elemental calcium total) by mouth 2 (two) times daily.   FLUoxetine 90 MG DR capsule Commonly known as: PROZAC WEEKLY Take 90 mg by mouth every 7 (seven) days. On Mondays   Hyoscyamine Sulfate SL 0.125 MG Subl Commonly known as: Levsin/SL 1 tablet three times a day as needed What changed:   how much to take  how to take this  when to take this  reasons to take this   levothyroxine 125 MCG tablet Commonly known as: Synthroid Take 1 tablet (125 mcg total) by mouth daily before breakfast.   multivitamin with minerals tablet Take 1 tablet by mouth daily.   nebivolol 10 MG tablet Commonly known as: BYSTOLIC Take 10 mg by mouth daily.   omeprazole 40 MG capsule Commonly known as: PRILOSEC Take 1 capsule (40 mg total) by mouth  daily. What changed:   when to take this  reasons to take this   PROBIOTIC DAILY PO Take 1 capsule by mouth daily.   traMADol 50 MG tablet Commonly known as: ULTRAM Take 1-2 tablets (50-100 mg total) by mouth every 6 (six) hours as needed.   vitamin C 500 MG tablet Commonly known as: ASCORBIC ACID Take 500 mg by mouth daily.        Earnstine Regal, MD, [facility] Office: [phone]   Signed: Armandina Gemma 09/17/2019, 8:57 AM

## 2019-09-17 NOTE — Progress Notes (Signed)
Pt alert and oriented, tolerating diet.  D/C instructions given, all questions answered.  Pt d/cd home.

## 2019-09-17 NOTE — Anesthesia Postprocedure Evaluation (Signed)
Anesthesia Post Note  Patient: Earl Hart  Procedure(s) Performed: TOTAL THYROIDECTOMY (N/A Neck)     Patient location during evaluation: PACU Anesthesia Type: General Level of consciousness: awake and alert Pain management: pain level controlled Vital Signs Assessment: post-procedure vital signs reviewed and stable Respiratory status: spontaneous breathing, nonlabored ventilation, respiratory function stable and patient connected to nasal cannula oxygen Cardiovascular status: blood pressure returned to baseline and stable Postop Assessment: no apparent nausea or vomiting Anesthetic complications: no    Last Vitals:  Vitals:   09/16/19 2300 09/17/19 0642  BP: (!) 141/79 136/85  Pulse: 61 61  Resp: 18 15  Temp: 37.1 C 37.2 C  SpO2: 100% 100%    Last Pain:  Vitals:   09/17/19 [id number]  TempSrc: Oral  PainSc:                  [name]

## 2019-09-17 NOTE — Plan of Care (Signed)
All goals met for d/c 

## 2019-09-21 LAB — SURGICAL PATHOLOGY

## 2019-10-06 ENCOUNTER — Telehealth: Payer: Self-pay | Admitting: Gastroenterology

## 2019-10-06 DIAGNOSIS — E890 Postprocedural hypothyroidism: Secondary | ICD-10-CM | POA: Diagnosis not present

## 2019-10-06 DIAGNOSIS — E042 Nontoxic multinodular goiter: Secondary | ICD-10-CM | POA: Diagnosis not present

## 2019-10-06 NOTE — Telephone Encounter (Signed)
Spoke with the patient. He reports he has continuing symptoms and he feels they may be a little worse than before. He is having stomach cramps and extremely loud "stomach noises." States people that are 6 feet away will turn around and look at him. He is taking the IBgard. States maybe it helps a little. He stopped taking Colestid. Could not tolerated levsin. Reports 2 to 3 yellow to clay colored stools daily. He does ask if there is another kind of specialist he should consult. Not unhappy with his care. He stresses this.

## 2019-10-06 NOTE — Telephone Encounter (Signed)
Patient is returning your call.  

## 2019-10-06 NOTE — Telephone Encounter (Signed)
No answer

## 2019-10-06 NOTE — Telephone Encounter (Signed)
Pt reported that his GI symptoms are worsening and would like to discuss.

## 2019-10-08 ENCOUNTER — Emergency Department (HOSPITAL_BASED_OUTPATIENT_CLINIC_OR_DEPARTMENT_OTHER)
Admission: EM | Admit: 2019-10-08 | Discharge: 2019-10-08 | Disposition: A | Discharge status: Home / Self Care | Payer: BC Managed Care – PPO | Attending: Emergency Medicine | Admitting: Emergency Medicine

## 2019-10-08 ENCOUNTER — Encounter (HOSPITAL_BASED_OUTPATIENT_CLINIC_OR_DEPARTMENT_OTHER): Payer: Self-pay | Admitting: *Deleted

## 2019-10-08 ENCOUNTER — Other Ambulatory Visit: Payer: Self-pay

## 2019-10-08 DIAGNOSIS — I10 Essential (primary) hypertension: Secondary | ICD-10-CM | POA: Diagnosis not present

## 2019-10-08 DIAGNOSIS — E042 Nontoxic multinodular goiter: Secondary | ICD-10-CM | POA: Diagnosis not present

## 2019-10-08 DIAGNOSIS — R1084 Generalized abdominal pain: Secondary | ICD-10-CM

## 2019-10-08 LAB — CBC WITH DIFFERENTIAL/PLATELET
Abs Immature Granulocytes: 0.02 10*3/uL (ref 0.00–0.07)
Basophils Absolute: 0.1 10*3/uL (ref 0.0–0.1)
Basophils Relative: 1 %
Eosinophils Absolute: 0.1 10*3/uL (ref 0.0–0.5)
Eosinophils Relative: 1 %
HCT: 45 % (ref 39.0–52.0)
Hemoglobin: 14.6 g/dL (ref 13.0–17.0)
Immature Granulocytes: 0 %
Lymphocytes Relative: 24 %
Lymphs Abs: 2.6 10*3/uL (ref 0.7–4.0)
MCH: 27.1 pg (ref 26.0–34.0)
MCHC: 32.4 g/dL (ref 30.0–36.0)
MCV: 83.6 fL (ref 80.0–100.0)
Monocytes Absolute: 1.1 10*3/uL — ABNORMAL HIGH (ref 0.1–1.0)
Monocytes Relative: 10 %
Neutro Abs: 7 10*3/uL (ref 1.7–7.7)
Neutrophils Relative %: 64 %
Platelets: 221 10*3/uL (ref 150–400)
RBC: 5.38 MIL/uL (ref 4.22–5.81)
RDW: 14.3 % (ref 11.5–15.5)
WBC: 10.8 10*3/uL — ABNORMAL HIGH (ref 4.0–10.5)
nRBC: 0 % (ref 0.0–0.2)

## 2019-10-08 LAB — COMPREHENSIVE METABOLIC PANEL
ALT: 33 U/L (ref 0–44)
AST: 38 U/L (ref 15–41)
Albumin: 4.1 g/dL (ref 3.5–5.0)
Alkaline Phosphatase: 73 U/L (ref 38–126)
Anion gap: 8 (ref 5–15)
BUN: 27 mg/dL — ABNORMAL HIGH (ref 6–20)
CO2: 27 mmol/L (ref 22–32)
Calcium: 9.5 mg/dL (ref 8.9–10.3)
Chloride: 103 mmol/L (ref 98–111)
Creatinine, Ser: 1.42 mg/dL — ABNORMAL HIGH (ref 0.61–1.24)
GFR calc Af Amer: >60 mL/min (ref >60)
GFR calc non Af Amer: 58 mL/min — ABNORMAL LOW (ref >60)
Glucose, Bld: 118 mg/dL — ABNORMAL HIGH (ref 70–99)
Potassium: 3.6 mmol/L (ref 3.5–5.1)
Sodium: 138 mmol/L (ref 135–145)
Total Bilirubin: 0.5 mg/dL (ref 0.3–1.2)
Total Protein: 7.2 g/dL (ref 6.5–8.1)

## 2019-10-08 LAB — URINALYSIS, ROUTINE W REFLEX MICROSCOPIC
Bilirubin Urine: NEGATIVE
Glucose, UA: NEGATIVE mg/dL
Ketones, ur: NEGATIVE mg/dL
Leukocytes,Ua: NEGATIVE
Nitrite: NEGATIVE
Protein, ur: NEGATIVE mg/dL
Specific Gravity, Urine: 1.01 (ref 1.005–1.030)
pH: 6.5 (ref 5.0–8.0)

## 2019-10-08 LAB — URINALYSIS, MICROSCOPIC (REFLEX)
Bacteria, UA: NONE SEEN
Squamous Epithelial / HPF: NONE SEEN (ref 0–5)
WBC, UA: NONE SEEN WBC/hpf (ref 0–5)

## 2019-10-08 LAB — LIPASE, BLOOD: Lipase: 25 U/L (ref 11–51)

## 2019-10-08 NOTE — ED Triage Notes (Signed)
Pt reports abdominal pain x 1 week. Has seen GI and was dx with IBS. Reports gurgling noises in his abdomen with 'pale yellow stool'.

## 2019-10-08 NOTE — ED Notes (Signed)
Pt. Reports no diarrhea but has had a soft stool today.    Pt. Reports he has had abd. Issues since Sept. But can;t let them go on.  Pt. Also reports he has had soft stools off an on.

## 2019-10-08 NOTE — Discharge Instructions (Signed)

## 2019-10-08 NOTE — ED Provider Notes (Signed)
Emergency Department Provider Note   I have reviewed the triage vital signs and the nursing notes.   HISTORY  Chief Complaint Abdominal Pain   HPI Earl Hart is a 48 y.o. male presents to the emergency department for evaluation of continued diffuse abdominal pain with acute change in stool color.  Patient states that he is followed closely with gastroenterology and been treated multiple tests including endoscopy, MRI abdomen, ultrasound of the right upper quadrant.  No acute abnormalities have been found and he has been diagnosed with IBS.  He has had intolerance to several medications in the recent past.  No acute allergic type reactions.  He continues to follow with his PCP and GI.  He states that over the past several days his abdominal cramping pain has become more severe.  He denies unilateral or focal pain.  No vomiting.  His stool color has changed to a lighter tan type color which was concerning for him and he presents for evaluation.  He denies any gross blood or melena in the stool.  No radiation of symptoms or other modifying factors.   Past Medical History:  Diagnosis Date  . Anxiety   . H. pylori infection   . Hypertension   . Multiple thyroid nodules   . Thyroid disease    surgery planned 09/2019, with surgeon stated tracheal deviation     Patient Active Problem List   Diagnosis Date Noted  . Enlarged thyroid 09/12/2019  . Multiple thyroid nodules 09/12/2019  . Tracheal deviation 09/12/2019    Past Surgical History:  Procedure Laterality Date  . COLONOSCOPY  08/16/2019  . THYROIDECTOMY N/A 09/16/2019   Procedure: TOTAL THYROIDECTOMY;  Surgeon: Armandina Gemma, MD;  Location: [facility];  Service: General;  Laterality: N/A;  . UPPER GASTROINTESTINAL ENDOSCOPY  08/16/2019    Allergies Penicillins  Family History  Problem Relation Age of Onset  . Hypertension Mother   . Hypertension Father   . Esophageal cancer Neg Hx   . Pancreatic cancer Neg Hx   .  Stomach cancer Neg Hx   . Rectal cancer Neg Hx   . Liver disease Neg Hx   . Liver cancer Neg Hx   . Colon cancer Neg Hx   . Colon polyps Neg Hx     Social History Social History   Tobacco Use  . Smoking status: Never Smoker  . Smokeless tobacco: Never Used  Substance Use Topics  . Alcohol use: No  . Drug use: Never    Review of Systems  Constitutional: No fever/chills Eyes: No visual changes. ENT: No sore throat. Cardiovascular: Denies chest pain. Respiratory: Denies shortness of breath. Gastrointestinal: Positive abdominal pain.  No nausea, no vomiting.  No diarrhea.  No constipation. Positive change in stool color.  Genitourinary: Negative for dysuria. Musculoskeletal: Negative for back pain. Skin: Negative for rash. Neurological: Negative for headaches, focal weakness or numbness.  10-point ROS otherwise negative.  ____________________________________________   PHYSICAL EXAM:  VITAL SIGNS: ED Triage Vitals  Enc Vitals Group     BP 10/08/19 2003 (!) 156/98     Pulse Rate 10/08/19 2003 72     Resp 10/08/19 2003 18     Temp 10/08/19 2003 98.7 F (37.1 C)     Temp Source 10/08/19 2003 Oral     SpO2 10/08/19 2003 98 %     Weight 10/08/19 2004 202 lb (91.6 kg)     Height 10/08/19 2004 5\' 11"  (1.803 m)   Constitutional: Alert and  oriented. Well appearing and in no acute distress. Eyes: Conjunctivae are normal.  Head: Atraumatic. Nose: No congestion/rhinnorhea. Mouth/Throat: Mucous membranes are moist.  Neck: No stridor.   Cardiovascular: Normal rate, regular rhythm. Good peripheral circulation. Grossly normal heart sounds.   Respiratory: Normal respiratory effort.  No retractions. Lungs CTAB. Gastrointestinal: Soft and nontender. No distention.  Musculoskeletal: No lower extremity tenderness nor edema. No gross deformities of extremities. Neurologic:  Normal speech and language. No gross focal neurologic deficits are appreciated.  Skin:  Skin is warm, dry  and intact. No rash noted.  ____________________________________________   LABS (all labs ordered are listed, but only abnormal results are displayed)  Labs Reviewed  COMPREHENSIVE METABOLIC PANEL - Abnormal; Notable for the following components:      Result Value   Glucose, Bld 118 (*)    BUN 27 (*)    Creatinine, Ser 1.42 (*)    GFR calc non Af Amer 58 (*)    All other components within normal limits  CBC WITH DIFFERENTIAL/PLATELET - Abnormal; Notable for the following components:   WBC 10.8 (*)    Monocytes Absolute 1.1 (*)    All other components within normal limits  URINALYSIS, ROUTINE W REFLEX MICROSCOPIC - Abnormal; Notable for the following components:   Hgb urine dipstick TRACE (*)    All other components within normal limits  LIPASE, BLOOD  URINALYSIS, MICROSCOPIC (REFLEX)   ____________________________________________   PROCEDURES  Procedure(s) performed:   Procedures  None ____________________________________________   INITIAL IMPRESSION / ASSESSMENT AND PLAN / ED COURSE  Pertinent labs & imaging results that were available during my care of the patient were reviewed by me and considered in my medical decision making (see chart for details).   Patient presents to the emergency department with worsening diffuse abdominal pain and change in stool color.  Patient has no focal tenderness on exam.  Negative Murphy sign.  Lab work obtained to assess for possible acute hepatitis, biliary obstruction, or leukocytosis that might suggest infection.  No evidence of pancreatitis clinically or on labs.  LFTs and bilirubin are normal.  Despite the patient's diffuse discomfort he has no focal tenderness on my exam.  I do not feel that abdominal imaging is indicated.  I have advised that he follow closely with his GI and PCP on Monday by phone to schedule follow-up appointments.   ____________________________________________  FINAL CLINICAL IMPRESSION(S) / ED DIAGNOSES   Final diagnoses:  Generalized abdominal pain    Note:  This document was prepared using Dragon voice recognition software and may include unintentional dictation errors.  Nanda Quinton, MD, [facility]    Dore Oquin, [facility], MD 10/09/19 [phone]

## 2019-10-10 DIAGNOSIS — Z20828 Contact with and (suspected) exposure to other viral communicable diseases: Secondary | ICD-10-CM | POA: Diagnosis not present

## 2019-10-11 ENCOUNTER — Telehealth: Payer: Self-pay | Admitting: Gastroenterology

## 2019-10-11 NOTE — Telephone Encounter (Signed)
We can send referral to Dr Derrill Kay. Thanks

## 2019-10-12 NOTE — Telephone Encounter (Signed)
His symptoms are predominantly functional, no further testing is recommended in the interim. Follow up with Dr Derrill Kay as scheduled and continue current regimen. Thanks

## 2019-10-12 NOTE — Telephone Encounter (Signed)
Patient is advised.  

## 2019-10-12 NOTE — Telephone Encounter (Signed)
In person visit scheduled for 12/22/19 at 8:00 am with Dr Derrill Kay. A request for a tele-visit has been sent to the provider. The patient is aware of this.  Patient was seen in the ED on 10/08/19. Sent home with instructions to follow up with GI. See labs done at ED. He has restarted Levsin. He states though his symptoms are not gone, there is a decrease in the intensity of his symptoms. No visual disturbance at this point.   Patient asks if there is anything else he can do. His diet is low-fat, low sugar, no dairy, and no sodas. He is hyper-aware of the color change of his stools. Please advise

## 2019-10-12 NOTE — Telephone Encounter (Signed)
ED visit on 10/08/19. Patient told to follow up with GI. See the telephone encounter of 10/06/19.

## 2019-10-26 ENCOUNTER — Ambulatory Visit (INDEPENDENT_AMBULATORY_CARE_PROVIDER_SITE_OTHER): Payer: BC Managed Care – PPO | Admitting: Psychiatry

## 2019-10-26 ENCOUNTER — Other Ambulatory Visit: Payer: Self-pay

## 2019-10-26 DIAGNOSIS — F4001 Agoraphobia with panic disorder: Secondary | ICD-10-CM

## 2019-10-26 DIAGNOSIS — F4010 Social phobia, unspecified: Secondary | ICD-10-CM | POA: Diagnosis not present

## 2019-10-26 DIAGNOSIS — F330 Major depressive disorder, recurrent, mild: Secondary | ICD-10-CM | POA: Diagnosis not present

## 2019-10-26 NOTE — Progress Notes (Addendum)
PROBLEM-FOCUSED INITIAL PSYCHOTHERAPY EVALUATION Luan Moore, PhD LP [facility]  Name: Earl Hart Date: 10/26/2019 Time spent: 50 min MRN: [id number] DOB: 1970-10-16 Guardian/Payee: self  PCP: Haywood Pao, MD Documentation requested on this visit: No  PROBLEM HISTORY Reason for Visit /Presenting Problem:  Chief Complaint  Patient presents with  . Establish Care  . Anxiety  . Depression    Narrative/History of Present Illness Referred by self for treatment of anxiety and depression.  PT reports some anxiety and depression lifelong.  On Prozac weekly (90mg  long-acting) for years, c. 17 yrs now.  Began with panic attacks in the night, which began in late teens, college, went away, came back early 60s.  Medical tests ruled out, dx'd panic disorder, PCP prescribed, stopped.  Been working from home during pandemic Earl Hart), anxiety running higher and feeling depressed.    Long history of benign familial tremor, worsening a bit at this age and stage.  September 2020 began stomach issues, discovered H pylori, antibiotic worked well but still gastric distress.  GI did endo/colo, ultrasound, MRI, dx'd IBS-combined.  Stool discolored, seeing a higher level GI in [place] next month.  On probiotic.  Complete thyroidectomy 12/3 for enlarged thyroid, now on levothyroxine.  High BP since teens, medicated since 20s, stably.  Told the GI sxs could be "mental", hence the referral.     No panic attacks now for some years, but will find himself apprehensive, with no particular focus of worry.  Coping with exercise and calling people.  These days will still have some restless nights.  Can get little pains and symptoms that start to spike worry of heart attack but has been through tests and ruled out.  Living single throughout adult life.  Dislikes the loneliness of living and working at home.  Pandemic has stifled socialization with friends.  Enjoys running but has been off  while recovering from surgery.  Phone calls with mother BID.  Hides his feelings b/c she would freak out.  Hx of his younger brother suiciding in 2014 due to depression.  PT never suicidal.  Denies trauma, abuse, formative experiences that would create intense anxiety.  On propranolol for tremor, helpful.  Experiences some public speaking anxiety.  Family look at him as one of the "smart ones" in the family (successful), and have pressed him to speak to groups of 30-40.  Minor OCD reported -- rechecking car locks, stove off, but really just inconvenient doubt and alertness, doesn't chew up time.  Obsesses about home problems like leaks when they come up, has to get it done today, even if it costs considerably more.  Impatient with slow-moving people.  Typical sleep 10/11p-4/5a, up for bathroom, then doze until 7a, 8:30 on weekends.  Winter bothers most, has seasonal depression, feels down.  Prior Psychiatric Assessment/Treatment:   Outpatient treatment: New to therapy.  Had November telehealth with psychiatrist, heavy accent and hurried.  Did not engage for ongoing mediation management.  Psychiatric hospitalization: no Psychological assessment/testing: none stated   Abuse/neglect screening: Victim of abuse: No.   Victim of neglect: No.   Perpetrator of abuse/neglect: No.   Witness / Exposure to Domestic Violence: No.   Witness to [employer] Violence:  No.   Protective Services Involvement: No.   Report needed: No.    Substance abuse screening: Current substance abuse: No.   History of impactful substance use/abuse: No.     FAMILY/SOCIAL HISTORY Family of origin -- brother's suicide, as above.  Remainder deferred.  Family of intention/current living situation -- lives along, all of adult life.  Gay, not in relationship. Education -- deferred, presumed college, possibly graduate degree Vocation -- [employer] -- Current income from Employment, with no stated concerns. Spiritually  -- deferred Enjoyable activities -- deferred Other situational factors affecting treatment and prognosis: Stressors from the following areas: Health problems and relative isolation Barriers to service: none stated  Notable cultural sensitivities: none stated Strengths: Hopefulness, Self Advocate and Able to Communicate Effectively   MED/SURG HISTORY Med/surg history was partially reviewed with PT at this time.  Of note for psychotherapy at this time is postsurgical recovery. Past Medical History:  Diagnosis Date  . Anxiety   . H. pylori infection   . Hypertension   . Multiple thyroid nodules   . Thyroid disease    surgery planned 09/2019, with surgeon stated tracheal deviation      Past Surgical History:  Procedure Laterality Date  . COLONOSCOPY  08/16/2019  . THYROIDECTOMY N/A 09/16/2019   Procedure: TOTAL THYROIDECTOMY;  Surgeon: Armandina Gemma, MD;  Location: [facility];  Service: General;  Laterality: N/A;  . UPPER GASTROINTESTINAL ENDOSCOPY  08/16/2019    Allergies  Allergen Reactions  . Penicillins Rash    Did it involve swelling of the face/tongue/throat, SOB, or low BP? No Did it involve sudden or severe rash/hives, skin peeling, or any reaction on the inside of your mouth or nose? No Did you need to seek medical attention at a hospital or doctor's office? Yes When did it last happen?in your 30s If all above answers are "NO", may proceed with cephalosporin use.     Medications (as listed in Epic): Current Outpatient Medications  Medication Sig Dispense Refill  . amLODipine (NORVASC) 10 MG tablet Take 10 mg by mouth daily.  3  . calcium carbonate (TUMS) 500 MG chewable tablet Chew 2 tablets (400 mg of elemental calcium total) by mouth 2 (two) times daily. 90 tablet 1  . FLUoxetine (PROZAC WEEKLY) 90 MG DR capsule Take 90 mg by mouth every 7 (seven) days. On Mondays    . hydrocortisone (ANUSOL-HC) 25 MG suppository PLACE 1 SUPPOSITORY (25 MG TOTAL) RECTALLY AT  BEDTIME. USE FOR 5 TO 7 DAYS 12 suppository 0  . Hyoscyamine Sulfate SL (LEVSIN/SL) 0.125 MG SUBL 1 tablet three times a day as needed (Patient taking differently: Take 1 tablet by mouth 3 (three) times daily as needed (IBS). 1 tablet three times a day as needed) 120 tablet 3  . levothyroxine (SYNTHROID) 125 MCG tablet Take 1 tablet (125 mcg total) by mouth daily before breakfast. 30 tablet 2  . Multiple Vitamins-Minerals (MULTIVITAMIN WITH MINERALS) tablet Take 1 tablet by mouth daily.    . nebivolol (BYSTOLIC) 10 MG tablet Take 10 mg by mouth daily.    Marland Kitchen omeprazole (PRILOSEC) 40 MG capsule Take 1 capsule (40 mg total) by mouth daily. (Patient taking differently: Take 40 mg by mouth daily as needed (heart burn). ) 90 capsule 3  . Probiotic Product (PROBIOTIC DAILY PO) Take 1 capsule by mouth daily.    . traMADol (ULTRAM) 50 MG tablet Take 1-2 tablets (50-100 mg total) by mouth every 6 (six) hours as needed. 15 tablet 0  . vitamin C (ASCORBIC ACID) 500 MG tablet Take 500 mg by mouth daily.     No current facility-administered medications for this visit.    MENTAL STATUS AND OBSERVATIONS Appearance:   Casual and Neat     Behavior:  Appropriate  Motor:  Normal  Speech/Language:   Clear and Coherent  Affect:  Appropriate  Mood:  anxious and responsive  Thought process:  normal  Thought content:    WNL  Sensory/Perceptual disturbances:    WNL  Orientation:  Fully oriented  Attention:  Good  Concentration:  Good  Memory:  WNL  Fund of knowledge:   Good  Insight:    Good  Judgment:   Good  Impulse Control:  Good   Initial Risk Assessment: Danger to self: No Self-injurious behavior: No Danger to others: No Physical aggression / violence: No Duty to warn: No Access to firearms a concern: No Gang involvement: No Patient / guardian was educated about steps to take if suicide or homicide risk level increases between visits: yes . While future psychiatric events cannot be accurately  predicted, the patient does not currently require acute inpatient psychiatric care and does not currently meet [facility] involuntary commitment criteria.   DIAGNOSIS:    ICD-10-CM   1. Panic disorder with agoraphobia  F40.01   2. Major depressive disorder, recurrent episode, mild with seasonal pattern ([place])  F33.0   3. Social phobia involving fear of public speaking  A999333     INITIAL TREATMENT: . Support/validation provided for distressing symptoms and confirmed rapport . Ethical orientation and informed consent confirmed re: o privacy rights -- including but not limited to HIPAA, EMR and use of e-PHI o patient responsibilities -- scheduling, fair notice of changes, in-person vs. telehealth and regulatory and financial conditions affecting choice o expectations for working relationship in psychotherapy o needs and consents for working partnerships and exchange of information with other health care providers, especially any medication and other behavioral health providers . Initial orientation to cognitive-behavioral and solution-focused therapy approach . Initial goalsetting: o Cope better with anxiety, including improving ability to fall asleep o Better trust physical functioning, where reasonable o Overcome isolation more . Psychoeducation and initial recommendations: o ANS functioning and fight/flight, relaxation methods, typical treatment methods for panic control and distress reduction   o Given copy of workbook chapter on panic control o Validated that he has had a lot come up medically, he does experience a collection of bothersome symptoms and issues, and there may be further adjustments his body makes following surgery and other interventions.  Stress management should help calm some sxs and make others more tolerable. o Affirmed current use of supplements and exercise o Discussed SSRI "poop-out" and possibility that a medication change could renew the benefit he has  received.  Given low daily dose with weekly Prozac, could also benefit from dose increase if desired. o Validated subtle pressure of being the high achiever among family, affirmed handling of the role, encouraged in ways of responding to unwanted requests o Outlook for therapy -- scheduling constraints, availability of crisis service, inclusion of family member(s) as appropriate  Plan: . Initial homework to read panic control chapter, work through symptom explanations and learning breath-counting technique.  Will refine next visit.  OK to work ahead into cognitive reappraisal if desired and understandable. . Maintain medication as prescribed and work faithfully with relevant prescriber(s) if any changes are desired or seem indicated . Call the clinic on-call service, present to ER, or call 911 if any life-threatening psychiatric crisis Return in about 2 weeks (around 11/09/2019) for time as available.  Blanchie Serve, PhD  Luan Moore, PhD LP Clinical Psychologist, [facility] [address] [place], [place] [address] [phone]

## 2019-10-29 ENCOUNTER — Other Ambulatory Visit: Payer: Self-pay

## 2019-10-29 ENCOUNTER — Telehealth: Payer: Self-pay | Admitting: Gastroenterology

## 2019-10-29 DIAGNOSIS — E042 Nontoxic multinodular goiter: Secondary | ICD-10-CM | POA: Diagnosis not present

## 2019-10-29 MED ORDER — HYDROCORTISONE ACETATE 25 MG RE SUPP: 25.0000 mg | Freq: Every day | RECTAL | 0 refills | Status: DC

## 2019-10-29 NOTE — Telephone Encounter (Signed)
Ok please send prescription for Anusol suppository at bedtime as needed for 5 to 7 days.  Thank you

## 2019-10-29 NOTE — Telephone Encounter (Signed)
Complains of hemorrhoids. State using OTC treatment of Tucks wipes and OTC Prep H cream. No bleeding. Itching and some burning of rectum. Willing to use suppositories.  Appointment with Dr Derrill Kay is 11/24/19

## 2019-10-29 NOTE — Telephone Encounter (Signed)
Patient is advised.  

## 2019-11-03 DIAGNOSIS — R197 Diarrhea, unspecified: Secondary | ICD-10-CM | POA: Diagnosis not present

## 2019-11-03 DIAGNOSIS — Z8639 Personal history of other endocrine, nutritional and metabolic disease: Secondary | ICD-10-CM | POA: Diagnosis not present

## 2019-11-03 DIAGNOSIS — E039 Hypothyroidism, unspecified: Secondary | ICD-10-CM | POA: Diagnosis not present

## 2019-11-03 DIAGNOSIS — Z9009 Acquired absence of other part of head and neck: Secondary | ICD-10-CM | POA: Diagnosis not present

## 2019-11-03 DIAGNOSIS — Z6828 Body mass index (BMI) 28.0-28.9, adult: Secondary | ICD-10-CM | POA: Diagnosis not present

## 2019-11-03 DIAGNOSIS — R7303 Prediabetes: Secondary | ICD-10-CM | POA: Diagnosis not present

## 2019-11-11 ENCOUNTER — Other Ambulatory Visit: Payer: Self-pay | Admitting: Gastroenterology

## 2019-11-13 ENCOUNTER — Other Ambulatory Visit: Payer: Self-pay | Admitting: Gastroenterology

## 2019-11-17 ENCOUNTER — Other Ambulatory Visit: Payer: Self-pay | Admitting: Gastroenterology

## 2019-11-24 DIAGNOSIS — K649 Unspecified hemorrhoids: Secondary | ICD-10-CM | POA: Diagnosis not present

## 2019-11-24 DIAGNOSIS — R109 Unspecified abdominal pain: Secondary | ICD-10-CM | POA: Diagnosis not present

## 2019-11-24 DIAGNOSIS — R140 Abdominal distension (gaseous): Secondary | ICD-10-CM | POA: Diagnosis not present

## 2019-11-30 ENCOUNTER — Other Ambulatory Visit: Payer: Self-pay

## 2019-11-30 ENCOUNTER — Ambulatory Visit (INDEPENDENT_AMBULATORY_CARE_PROVIDER_SITE_OTHER): Payer: BC Managed Care – PPO | Admitting: Psychiatry

## 2019-11-30 DIAGNOSIS — F330 Major depressive disorder, recurrent, mild: Secondary | ICD-10-CM

## 2019-11-30 DIAGNOSIS — K6389 Other specified diseases of intestine: Secondary | ICD-10-CM

## 2019-11-30 DIAGNOSIS — F4010 Social phobia, unspecified: Secondary | ICD-10-CM | POA: Diagnosis not present

## 2019-11-30 DIAGNOSIS — F4001 Agoraphobia with panic disorder: Secondary | ICD-10-CM

## 2019-11-30 NOTE — Progress Notes (Signed)
Psychotherapy Progress Note [facility] Luan Moore, PhD LP   Patient ID: Earl Hart     MRN: [id number] Therapy format: Individual psychotherapy Date: 11/30/2019      Start: 3:10p     Stop: 4:01p     Time Spent: 51 min Location: In-person   Session narrative (presenting needs, interim history, self-report of stressors and symptoms, applications of prior therapy, status changes, and interventions made in session) Been in GI assessment, testing for SIBO next week with functional GI specialist in [place].  On Bentyl for a week now to slow down loose stools, is helping.  Taken with psyllium fiber.  No c/o constipation.  Was dealing with cramping, noises, and gas, which have subsided so far.  Does have long history of GI reactivity with anxiety, with urgent needs to defecate, and quite possible that intestinal imbalance may have been involved, e.g., underproducing GABA.  Anxiety symptoms are maybe 20% better, too early to tell much. Psychoed provided re neurotransmitter production, the value of intestinal biodiversity, and benefits of both slowing accelerated bowel and correcting biodiversity.  Found the panic control chapter very informative and familiar re. his panic attacks, some of which are social-anxiety related.  Since reading, has not had as prolonged anxiety attacks.  Has practiced recognizing anxiety coming, applying diaphragmatic breaths, loosen shoulders, distracts, reminds himself of medical truths.  Helpful so far.  Further tuned breathing and relaxation technique in session and oriented to future application of interoceptive desensitization.  Psychoed re   Therapeutic modalities: Cognitive Behavioral Therapy, Solution-Oriented/Positive Psychology and Psycho-education/Bibliotherapy  Mental Status/Observations:  Appearance:   Neat     Behavior:  Appropriate  Motor:  Normal  Speech/Language:   Clear and Coherent  Affect:  Appropriate  Mood:  euthymic   Thought process:  normal  Thought content:    WNL  Sensory/Perceptual disturbances:    WNL  Orientation:  Fully oriented  Attention:  Good  Concentration:  Good  Memory:  WNL  Insight:    Good  Judgment:   Good  Impulse Control:  Good   Risk Assessment: Danger to Self: No Self-injurious Behavior: No Danger to Others: No Physical Aggression / Violence: No Duty to Warn: No Access to Firearms a concern: No  Assessment of progress:  progressing  Diagnosis:   ICD-10-CM   1. Panic disorder with agoraphobia  F40.01   2. Social phobia involving fear of public speaking  A999333   3. Major depressive disorder, recurrent episode, mild with seasonal pattern ([place])  F33.0   4. Small intestinal bacterial overgrowth (rule out)  K63.89    Plan:  . Continue practice of breathing/self-soothing technique . Read ahead for the remainder of panic control -- assess whether thought journaling and cognitive reappraisal seem more applicable, warm up to interoceptive desensitization work . Other recommendations/advice as may be noted above . Continue to utilize previously learned skills ad lib . Maintain medication as prescribed and work faithfully with relevant prescriber(s) if any changes are desired or seem indicated . Call the clinic on-call service, present to ER, or call 911 if any life-threatening psychiatric crisis . Return in about 2 weeks (around 12/14/2019).Marland Kitchen  Next scheduled visit in this office Visit date not found.  Blanchie Serve, PhD Luan Moore, PhD LP Clinical Psychologist, [facility] [address], [place], [place] [address] [phone]

## 2019-12-06 DIAGNOSIS — M9901 Segmental and somatic dysfunction of cervical region: Secondary | ICD-10-CM | POA: Diagnosis not present

## 2019-12-06 DIAGNOSIS — M9903 Segmental and somatic dysfunction of lumbar region: Secondary | ICD-10-CM | POA: Diagnosis not present

## 2019-12-06 DIAGNOSIS — M9905 Segmental and somatic dysfunction of pelvic region: Secondary | ICD-10-CM | POA: Diagnosis not present

## 2019-12-06 DIAGNOSIS — M9904 Segmental and somatic dysfunction of sacral region: Secondary | ICD-10-CM | POA: Diagnosis not present

## 2019-12-07 DIAGNOSIS — M9905 Segmental and somatic dysfunction of pelvic region: Secondary | ICD-10-CM | POA: Diagnosis not present

## 2019-12-07 DIAGNOSIS — M9903 Segmental and somatic dysfunction of lumbar region: Secondary | ICD-10-CM | POA: Diagnosis not present

## 2019-12-07 DIAGNOSIS — M9904 Segmental and somatic dysfunction of sacral region: Secondary | ICD-10-CM | POA: Diagnosis not present

## 2019-12-07 DIAGNOSIS — M9901 Segmental and somatic dysfunction of cervical region: Secondary | ICD-10-CM | POA: Diagnosis not present

## 2019-12-08 DIAGNOSIS — M9903 Segmental and somatic dysfunction of lumbar region: Secondary | ICD-10-CM | POA: Diagnosis not present

## 2019-12-08 DIAGNOSIS — M9901 Segmental and somatic dysfunction of cervical region: Secondary | ICD-10-CM | POA: Diagnosis not present

## 2019-12-08 DIAGNOSIS — Z01812 Encounter for preprocedural laboratory examination: Secondary | ICD-10-CM | POA: Diagnosis not present

## 2019-12-08 DIAGNOSIS — M9905 Segmental and somatic dysfunction of pelvic region: Secondary | ICD-10-CM | POA: Diagnosis not present

## 2019-12-08 DIAGNOSIS — M9904 Segmental and somatic dysfunction of sacral region: Secondary | ICD-10-CM | POA: Diagnosis not present

## 2019-12-08 DIAGNOSIS — Z20822 Contact with and (suspected) exposure to covid-19: Secondary | ICD-10-CM | POA: Diagnosis not present

## 2019-12-09 DIAGNOSIS — M9905 Segmental and somatic dysfunction of pelvic region: Secondary | ICD-10-CM | POA: Diagnosis not present

## 2019-12-09 DIAGNOSIS — M9904 Segmental and somatic dysfunction of sacral region: Secondary | ICD-10-CM | POA: Diagnosis not present

## 2019-12-09 DIAGNOSIS — M9903 Segmental and somatic dysfunction of lumbar region: Secondary | ICD-10-CM | POA: Diagnosis not present

## 2019-12-09 DIAGNOSIS — M9901 Segmental and somatic dysfunction of cervical region: Secondary | ICD-10-CM | POA: Diagnosis not present

## 2019-12-13 DIAGNOSIS — K909 Intestinal malabsorption, unspecified: Secondary | ICD-10-CM | POA: Diagnosis not present

## 2019-12-13 DIAGNOSIS — K9089 Other intestinal malabsorption: Secondary | ICD-10-CM | POA: Diagnosis not present

## 2019-12-13 DIAGNOSIS — R1030 Lower abdominal pain, unspecified: Secondary | ICD-10-CM | POA: Diagnosis not present

## 2019-12-13 DIAGNOSIS — R140 Abdominal distension (gaseous): Secondary | ICD-10-CM | POA: Diagnosis not present

## 2019-12-13 DIAGNOSIS — R109 Unspecified abdominal pain: Secondary | ICD-10-CM | POA: Diagnosis not present

## 2019-12-14 DIAGNOSIS — M9901 Segmental and somatic dysfunction of cervical region: Secondary | ICD-10-CM | POA: Diagnosis not present

## 2019-12-14 DIAGNOSIS — M9904 Segmental and somatic dysfunction of sacral region: Secondary | ICD-10-CM | POA: Diagnosis not present

## 2019-12-14 DIAGNOSIS — M9903 Segmental and somatic dysfunction of lumbar region: Secondary | ICD-10-CM | POA: Diagnosis not present

## 2019-12-14 DIAGNOSIS — M9905 Segmental and somatic dysfunction of pelvic region: Secondary | ICD-10-CM | POA: Diagnosis not present

## 2019-12-15 DIAGNOSIS — M9905 Segmental and somatic dysfunction of pelvic region: Secondary | ICD-10-CM | POA: Diagnosis not present

## 2019-12-15 DIAGNOSIS — M9901 Segmental and somatic dysfunction of cervical region: Secondary | ICD-10-CM | POA: Diagnosis not present

## 2019-12-15 DIAGNOSIS — M9903 Segmental and somatic dysfunction of lumbar region: Secondary | ICD-10-CM | POA: Diagnosis not present

## 2019-12-15 DIAGNOSIS — M9904 Segmental and somatic dysfunction of sacral region: Secondary | ICD-10-CM | POA: Diagnosis not present

## 2019-12-16 DIAGNOSIS — M9905 Segmental and somatic dysfunction of pelvic region: Secondary | ICD-10-CM | POA: Diagnosis not present

## 2019-12-16 DIAGNOSIS — M9904 Segmental and somatic dysfunction of sacral region: Secondary | ICD-10-CM | POA: Diagnosis not present

## 2019-12-16 DIAGNOSIS — M9901 Segmental and somatic dysfunction of cervical region: Secondary | ICD-10-CM | POA: Diagnosis not present

## 2019-12-16 DIAGNOSIS — M9903 Segmental and somatic dysfunction of lumbar region: Secondary | ICD-10-CM | POA: Diagnosis not present

## 2019-12-21 DIAGNOSIS — M9904 Segmental and somatic dysfunction of sacral region: Secondary | ICD-10-CM | POA: Diagnosis not present

## 2019-12-21 DIAGNOSIS — M9905 Segmental and somatic dysfunction of pelvic region: Secondary | ICD-10-CM | POA: Diagnosis not present

## 2019-12-21 DIAGNOSIS — M9901 Segmental and somatic dysfunction of cervical region: Secondary | ICD-10-CM | POA: Diagnosis not present

## 2019-12-21 DIAGNOSIS — M9903 Segmental and somatic dysfunction of lumbar region: Secondary | ICD-10-CM | POA: Diagnosis not present

## 2019-12-22 DIAGNOSIS — M9901 Segmental and somatic dysfunction of cervical region: Secondary | ICD-10-CM | POA: Diagnosis not present

## 2019-12-22 DIAGNOSIS — M9903 Segmental and somatic dysfunction of lumbar region: Secondary | ICD-10-CM | POA: Diagnosis not present

## 2019-12-22 DIAGNOSIS — M9904 Segmental and somatic dysfunction of sacral region: Secondary | ICD-10-CM | POA: Diagnosis not present

## 2019-12-22 DIAGNOSIS — M9905 Segmental and somatic dysfunction of pelvic region: Secondary | ICD-10-CM | POA: Diagnosis not present

## 2019-12-23 DIAGNOSIS — M9901 Segmental and somatic dysfunction of cervical region: Secondary | ICD-10-CM | POA: Diagnosis not present

## 2019-12-23 DIAGNOSIS — M9903 Segmental and somatic dysfunction of lumbar region: Secondary | ICD-10-CM | POA: Diagnosis not present

## 2019-12-23 DIAGNOSIS — M9905 Segmental and somatic dysfunction of pelvic region: Secondary | ICD-10-CM | POA: Diagnosis not present

## 2019-12-23 DIAGNOSIS — M9904 Segmental and somatic dysfunction of sacral region: Secondary | ICD-10-CM | POA: Diagnosis not present

## 2019-12-28 DIAGNOSIS — M9904 Segmental and somatic dysfunction of sacral region: Secondary | ICD-10-CM | POA: Diagnosis not present

## 2019-12-28 DIAGNOSIS — M9903 Segmental and somatic dysfunction of lumbar region: Secondary | ICD-10-CM | POA: Diagnosis not present

## 2019-12-28 DIAGNOSIS — M9905 Segmental and somatic dysfunction of pelvic region: Secondary | ICD-10-CM | POA: Diagnosis not present

## 2019-12-28 DIAGNOSIS — M9901 Segmental and somatic dysfunction of cervical region: Secondary | ICD-10-CM | POA: Diagnosis not present

## 2019-12-29 DIAGNOSIS — R140 Abdominal distension (gaseous): Secondary | ICD-10-CM | POA: Diagnosis not present

## 2019-12-30 DIAGNOSIS — M9903 Segmental and somatic dysfunction of lumbar region: Secondary | ICD-10-CM | POA: Diagnosis not present

## 2019-12-30 DIAGNOSIS — M9901 Segmental and somatic dysfunction of cervical region: Secondary | ICD-10-CM | POA: Diagnosis not present

## 2019-12-30 DIAGNOSIS — M9905 Segmental and somatic dysfunction of pelvic region: Secondary | ICD-10-CM | POA: Diagnosis not present

## 2019-12-30 DIAGNOSIS — M9904 Segmental and somatic dysfunction of sacral region: Secondary | ICD-10-CM | POA: Diagnosis not present

## 2019-12-31 DIAGNOSIS — E890 Postprocedural hypothyroidism: Secondary | ICD-10-CM | POA: Diagnosis not present

## 2019-12-31 DIAGNOSIS — K589 Irritable bowel syndrome without diarrhea: Secondary | ICD-10-CM | POA: Diagnosis not present

## 2019-12-31 DIAGNOSIS — D34 Benign neoplasm of thyroid gland: Secondary | ICD-10-CM | POA: Diagnosis not present

## 2019-12-31 DIAGNOSIS — R7301 Impaired fasting glucose: Secondary | ICD-10-CM | POA: Diagnosis not present

## 2019-12-31 DIAGNOSIS — Z9089 Acquired absence of other organs: Secondary | ICD-10-CM | POA: Diagnosis not present

## 2020-01-03 ENCOUNTER — Other Ambulatory Visit: Payer: Self-pay

## 2020-01-03 ENCOUNTER — Ambulatory Visit (INDEPENDENT_AMBULATORY_CARE_PROVIDER_SITE_OTHER): Payer: BC Managed Care – PPO | Admitting: Psychiatry

## 2020-01-03 DIAGNOSIS — F4010 Social phobia, unspecified: Secondary | ICD-10-CM | POA: Diagnosis not present

## 2020-01-03 DIAGNOSIS — E890 Postprocedural hypothyroidism: Secondary | ICD-10-CM

## 2020-01-03 DIAGNOSIS — F4001 Agoraphobia with panic disorder: Secondary | ICD-10-CM

## 2020-01-03 DIAGNOSIS — K929 Disease of digestive system, unspecified: Secondary | ICD-10-CM

## 2020-01-03 DIAGNOSIS — Z9889 Other specified postprocedural states: Secondary | ICD-10-CM | POA: Diagnosis not present
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
## 2020-01-04 DIAGNOSIS — M9904 Segmental and somatic dysfunction of sacral region: Secondary | ICD-10-CM | POA: Diagnosis not present

## 2020-01-04 DIAGNOSIS — M9903 Segmental and somatic dysfunction of lumbar region: Secondary | ICD-10-CM | POA: Diagnosis not present

## 2020-01-04 DIAGNOSIS — M9901 Segmental and somatic dysfunction of cervical region: Secondary | ICD-10-CM | POA: Diagnosis not present

## 2020-01-04 DIAGNOSIS — M9905 Segmental and somatic dysfunction of pelvic region: Secondary | ICD-10-CM | POA: Diagnosis not present

## 2020-01-06 DIAGNOSIS — M9905 Segmental and somatic dysfunction of pelvic region: Secondary | ICD-10-CM | POA: Diagnosis not present

## 2020-01-06 DIAGNOSIS — M9904 Segmental and somatic dysfunction of sacral region: Secondary | ICD-10-CM | POA: Diagnosis not present

## 2020-01-06 DIAGNOSIS — M9903 Segmental and somatic dysfunction of lumbar region: Secondary | ICD-10-CM | POA: Diagnosis not present

## 2020-01-06 DIAGNOSIS — M9901 Segmental and somatic dysfunction of cervical region: Secondary | ICD-10-CM | POA: Diagnosis not present

## 2020-01-11 DIAGNOSIS — M9904 Segmental and somatic dysfunction of sacral region: Secondary | ICD-10-CM | POA: Diagnosis not present

## 2020-01-11 DIAGNOSIS — M9905 Segmental and somatic dysfunction of pelvic region: Secondary | ICD-10-CM | POA: Diagnosis not present

## 2020-01-11 DIAGNOSIS — M9901 Segmental and somatic dysfunction of cervical region: Secondary | ICD-10-CM | POA: Diagnosis not present

## 2020-01-11 DIAGNOSIS — M9903 Segmental and somatic dysfunction of lumbar region: Secondary | ICD-10-CM | POA: Diagnosis not present

## 2020-01-13 DIAGNOSIS — R140 Abdominal distension (gaseous): Secondary | ICD-10-CM | POA: Diagnosis not present

## 2020-01-13 DIAGNOSIS — K828 Other specified diseases of gallbladder: Secondary | ICD-10-CM | POA: Diagnosis not present

## 2020-01-13 DIAGNOSIS — R932 Abnormal findings on diagnostic imaging of liver and biliary tract: Secondary | ICD-10-CM | POA: Diagnosis not present

## 2020-01-17 DIAGNOSIS — R140 Abdominal distension (gaseous): Secondary | ICD-10-CM | POA: Diagnosis not present

## 2020-01-17 DIAGNOSIS — K3189 Other diseases of stomach and duodenum: Secondary | ICD-10-CM | POA: Diagnosis not present

## 2020-01-17 DIAGNOSIS — K3184 Gastroparesis: Secondary | ICD-10-CM | POA: Diagnosis not present

## 2020-01-18 DIAGNOSIS — M9905 Segmental and somatic dysfunction of pelvic region: Secondary | ICD-10-CM | POA: Diagnosis not present

## 2020-01-18 DIAGNOSIS — M9903 Segmental and somatic dysfunction of lumbar region: Secondary | ICD-10-CM | POA: Diagnosis not present

## 2020-01-18 DIAGNOSIS — M9904 Segmental and somatic dysfunction of sacral region: Secondary | ICD-10-CM | POA: Diagnosis not present

## 2020-01-18 DIAGNOSIS — M9901 Segmental and somatic dysfunction of cervical region: Secondary | ICD-10-CM | POA: Diagnosis not present

## 2020-01-20 DIAGNOSIS — M9904 Segmental and somatic dysfunction of sacral region: Secondary | ICD-10-CM | POA: Diagnosis not present

## 2020-01-20 DIAGNOSIS — M9905 Segmental and somatic dysfunction of pelvic region: Secondary | ICD-10-CM | POA: Diagnosis not present

## 2020-01-20 DIAGNOSIS — M9903 Segmental and somatic dysfunction of lumbar region: Secondary | ICD-10-CM | POA: Diagnosis not present

## 2020-01-20 DIAGNOSIS — M9901 Segmental and somatic dysfunction of cervical region: Secondary | ICD-10-CM | POA: Diagnosis not present

## 2020-01-26 DIAGNOSIS — K911 Postgastric surgery syndromes: Secondary | ICD-10-CM | POA: Diagnosis not present

## 2020-01-27 DIAGNOSIS — E039 Hypothyroidism, unspecified: Secondary | ICD-10-CM | POA: Diagnosis not present

## 2020-01-27 DIAGNOSIS — E042 Nontoxic multinodular goiter: Secondary | ICD-10-CM | POA: Diagnosis not present

## 2020-01-27 DIAGNOSIS — R7303 Prediabetes: Secondary | ICD-10-CM | POA: Diagnosis not present

## 2020-02-02 DIAGNOSIS — Z8639 Personal history of other endocrine, nutritional and metabolic disease: Secondary | ICD-10-CM | POA: Diagnosis not present

## 2020-02-02 DIAGNOSIS — E039 Hypothyroidism, unspecified: Secondary | ICD-10-CM | POA: Diagnosis not present

## 2020-02-02 DIAGNOSIS — Z9009 Acquired absence of other part of head and neck: Secondary | ICD-10-CM | POA: Diagnosis not present

## 2020-02-02 DIAGNOSIS — R7303 Prediabetes: Secondary | ICD-10-CM | POA: Diagnosis not present

## 2020-02-03 ENCOUNTER — Ambulatory Visit (INDEPENDENT_AMBULATORY_CARE_PROVIDER_SITE_OTHER): Payer: BC Managed Care – PPO | Admitting: Psychiatry

## 2020-02-03 ENCOUNTER — Other Ambulatory Visit: Payer: Self-pay

## 2020-02-03 DIAGNOSIS — K929 Disease of digestive system, unspecified: Secondary | ICD-10-CM

## 2020-02-03 DIAGNOSIS — E890 Postprocedural hypothyroidism: Secondary | ICD-10-CM

## 2020-02-03 DIAGNOSIS — F4001 Agoraphobia with panic disorder: Secondary | ICD-10-CM

## 2020-02-03 DIAGNOSIS — F4010 Social phobia, unspecified: Secondary | ICD-10-CM | POA: Diagnosis not present

## 2020-02-03 DIAGNOSIS — Z9889 Other specified postprocedural states: Secondary | ICD-10-CM | POA: Diagnosis not present

## 2020-02-03 NOTE — Progress Notes (Signed)
Psychotherapy Progress Note [facility] Luan Moore, PhD LP  Patient ID: Earl Hart     MRN: [id number] Therapy format: Individual psychotherapy Date: 02/03/2020      Start: 4:23p     Stop: 4:06     Time Spent: 43 min Location: In-person   Session narrative (presenting needs, interim history, self-report of stressors and symptoms, applications of prior therapy, status changes, and interventions made in session) Has been working on abdominal breathing to tame anxiety.  Attacks not nearly as often   IBS continues, has been diagnosed with gastric dumping syndrome based on his testing.  On Bentyl now.  Endocrinology noted thyroid levels off -- attributed possibly to IBS/dumping, increased thyroid medication to compensate for malabsorption.    Today, little anxiety, est 3/10.  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  Management atmosphere is pretty healthy, amicable, hopeful.  Mainly, it's the solitude working at home that has felt more lonely making.    Characteristic to have a hyperactive sweat response.  Discussed how he might represent himself to others in the event of being (awkwardly) noticed, advocating for the basic truth that he has a hyperactive sweat response and matter-of-factness in saying so.  Acknowledged that the barber shop is a particular social anxiety trigger and that the way chairs are arranged pretty much forces being visible and interactive with each other.  Does not wish to do interoceptive exposure at this time but will look it over again and probably try at home.  Offer stands to orient and assess it here and encouraged to go through it for developing better  resistance to panic.  Is very appreciative of the safe place to reveal concerns and reckon with emotional and physical stresses, with strong implication it is helping him de-stigmatize a lot not only to do therapy, but to work interracial trust in the act of confiding in a white man.  Therapeutic modalities: Cognitive Behavioral Therapy and Solution-Oriented/Positive Psychology  Mental Status/Observations:  Appearance:   Casual, Neat and Well Groomed     Behavior:  Appropriate  Motor:  Normal  Speech/Language:   Clear and Coherent  Affect:  Appropriate  Mood:  anxious and less  Thought process:  normal  Thought content:    WNL  Sensory/Perceptual disturbances:    WNL  Orientation:  Fully oriented  Attention:  Good    Concentration:  Good  Memory:  WNL  Insight:    Good  Judgment:   Good  Impulse Control:  Good   Risk Assessment: Danger to Self: No Self-injurious Behavior: No Danger to Others: No Physical Aggression / Violence: No Duty to Warn: No Access to Firearms a concern: No  Assessment of progress:  progressing  Diagnosis:   ICD-10-CM   1. Panic disorder with agoraphobia  F40.01   2. Social phobia involving fear of public speaking  A999333   3. Digestive problems  K92.9   4. S/P thyroidectomy  Z98.890    Plan:  . Follow through on medical recommendations to rebalance thyroid and digestive system . Try out interoceptive desensitization exercises . Continue use of breathing skill for stress reduction and anti-panic . Continue use of psychoeducation material for neutralizing and  preventing panic . Other recommendations/advice as may be noted above . Continue to utilize previously learned skills ad lib . Maintain medication as prescribed and work faithfully with relevant prescriber(s) if any changes are desired or seem indicated . Call the clinic on-call service, present to ER, or call 911 if any life-threatening psychiatric crisis Return in about 1 month (around  03/04/2020). . Already scheduled visit in this office 03/02/2020.  Blanchie Serve, PhD Luan Moore, PhD LP Clinical Psychologist, [facility] [address] [place], [place] [address] [phone]

## 2020-02-17 DIAGNOSIS — M9901 Segmental and somatic dysfunction of cervical region: Secondary | ICD-10-CM | POA: Diagnosis not present

## 2020-02-17 DIAGNOSIS — M9903 Segmental and somatic dysfunction of lumbar region: Secondary | ICD-10-CM | POA: Diagnosis not present

## 2020-02-17 DIAGNOSIS — M9905 Segmental and somatic dysfunction of pelvic region: Secondary | ICD-10-CM | POA: Diagnosis not present

## 2020-02-17 DIAGNOSIS — M9904 Segmental and somatic dysfunction of sacral region: Secondary | ICD-10-CM | POA: Diagnosis not present

## 2020-03-02 ENCOUNTER — Other Ambulatory Visit: Payer: Self-pay

## 2020-03-02 ENCOUNTER — Ambulatory Visit (INDEPENDENT_AMBULATORY_CARE_PROVIDER_SITE_OTHER): Payer: BC Managed Care – PPO | Admitting: Psychiatry

## 2020-03-02 DIAGNOSIS — K929 Disease of digestive system, unspecified: Secondary | ICD-10-CM

## 2020-03-02 DIAGNOSIS — F4010 Social phobia, unspecified: Secondary | ICD-10-CM

## 2020-03-02 DIAGNOSIS — F4001 Agoraphobia with panic disorder: Secondary | ICD-10-CM | POA: Diagnosis not present

## 2020-03-02 NOTE — Progress Notes (Signed)
Psychotherapy Progress Note [facility] Luan Moore, PhD LP  Patient ID: Earl Hart     MRN: [id number] Therapy format: Individual psychotherapy Date: 03/02/2020      Start: 3:18p     Stop: 4:06p     Time Spent: 48 min Location: In-person   Session narrative (presenting needs, interim history, self-report of stressors and symptoms, applications of prior therapy, status changes, and interventions made in session) Continues to use slowing down, breathing and relaxation breaks, and exercise habit established.  Est 50% reduction in experienced stress.  Did a personal half-marathon Saturday.  Been trying to manage portions and diet.  Digestive enzymes helping more than dicyclomine was, with GI doctor approving, and substantial relief from cramping and growling.    Since here last, stress is on about selling his home (and buying another).  Not a panic-producer but sense of some urgency, as well as expected competition, in this buyer's market.  Hopeful, has a sweet offer for his own place, has steadily invested in his home over the past year in a way that seems poised to pay off well.  Just needs to have a destination settled, will be looking at an attractive townhome shortly.  Affirmed and encouraged.  Non-provocative trip to the barber shop last week.  Figures an interested, engaged 1:1 conversation helped.  No other social anxiety provocations past month.  Can feel self-conscious at grocery, among other situations, particularly if inconvenient sweat response starts up.  Discussed options for dealing with self-consciousness and anxiety in public, contrasting the intentional relaxation-based approach with the mindful, escape overly self-conscious approach.  Brainstormed observation games he could try to escape self-consciousness, e.g., finding the tallest and shortest people, counting who is wearing blue, identifying what products got placed at the front of the store, how many lanes  are open.  Encouraged to look for observation games he could try in situations he finds himself waiting.    Discussed one source of anxiety in his mother's anxiety and penchant for compulsive problem-solving and identifying real or fictional drama.  Has developed habit himself of not revealing personal matters in progress in order to avoid 20 questions or dramatic suspicions on her part.  Brainstormed counters to this, should he want to be more open and less burdened with the need to restrain self-disclosure with her, ranging from joking ("I'm selling my house to make you ask questions, Mama") to frankly, nonprovocatively asking to change the conversation (e.g., "Would it be OK with you if we change the subject?" if she is gushing uncomfortably about the praise a friend of hers lavished on him).    Therapeutic modalities: Cognitive Behavioral Therapy and Solution-Oriented/Positive Psychology  Mental Status/Observations:  Appearance:   Casual and Neat     Behavior:  Appropriate  Motor:  Normal  Speech/Language:   Clear and Coherent  Affect:  Appropriate  Mood:  normal  Thought process:  normal  Thought content:    WNL  Sensory/Perceptual disturbances:    WNL  Orientation:  Fully oriented  Attention:  Good    Concentration:  Good  Memory:  WNL  Insight:    Good  Judgment:   Good  Impulse Control:  Good   Risk Assessment: Danger to Self: No Self-injurious Behavior: No Danger to Others: No Physical Aggression / Violence: No Duty to Warn: No Access to Firearms a concern: No  Assessment of progress:  progressing  Diagnosis:   ICD-10-CM   1. Social anxiety disorder  F40.10  2. Panic disorder with agoraphobia  F40.01   3. Digestive problems - r/o SIBO  K92.9    Plan:  . Continue use of breathing/relaxation skill, option come back to interoceptive desensitization for relapse prevention . Continue appropriate self-treatment of digestive issues . Consider communication options with  mother for awkward/unwanted interactions . Other recommendations/advice as may be noted above . Continue to utilize previously learned skills ad lib . Maintain medication as prescribed and work faithfully with relevant prescriber(s) if any changes are desired or seem indicated . Call the clinic on-call service, present to ER, or call 911 if any life-threatening psychiatric crisis Return in about 1 month (around 04/02/2020). . Already scheduled visit in this office 04/06/2020.  Blanchie Serve, PhD Luan Moore, PhD LP Clinical Psychologist, [facility] Group [facility] [address] [place], [place] [address] [phone]

## 2020-03-31 DIAGNOSIS — K911 Postgastric surgery syndromes: Secondary | ICD-10-CM | POA: Diagnosis not present

## 2020-03-31 DIAGNOSIS — K589 Irritable bowel syndrome without diarrhea: Secondary | ICD-10-CM | POA: Diagnosis not present

## 2020-03-31 DIAGNOSIS — R7301 Impaired fasting glucose: Secondary | ICD-10-CM | POA: Diagnosis not present

## 2020-03-31 DIAGNOSIS — E890 Postprocedural hypothyroidism: Secondary | ICD-10-CM | POA: Diagnosis not present

## 2020-04-04 DIAGNOSIS — K911 Postgastric surgery syndromes: Secondary | ICD-10-CM | POA: Diagnosis not present

## 2020-04-04 DIAGNOSIS — Z6829 Body mass index (BMI) 29.0-29.9, adult: Secondary | ICD-10-CM | POA: Diagnosis not present

## 2020-04-04 DIAGNOSIS — Z713 Dietary counseling and surveillance: Secondary | ICD-10-CM | POA: Diagnosis not present

## 2020-04-06 ENCOUNTER — Ambulatory Visit (INDEPENDENT_AMBULATORY_CARE_PROVIDER_SITE_OTHER): Payer: BC Managed Care – PPO | Admitting: Psychiatry

## 2020-04-06 ENCOUNTER — Other Ambulatory Visit: Payer: Self-pay

## 2020-04-06 DIAGNOSIS — F4010 Social phobia, unspecified: Secondary | ICD-10-CM

## 2020-04-06 DIAGNOSIS — F4001 Agoraphobia with panic disorder: Secondary | ICD-10-CM | POA: Diagnosis not present

## 2020-04-06 DIAGNOSIS — F4320 Adjustment disorder, unspecified: Secondary | ICD-10-CM

## 2020-04-06 NOTE — Progress Notes (Signed)
Psychotherapy Progress Note [facility] Luan Moore, PhD LP  Patient ID: Earl Hart     MRN: [id number] Therapy format: Individual psychotherapy Date: 04/06/2020      Start: 3:15p     Stop: 4:00p     Time Spent: 45 min Location: In-person   Session narrative (presenting needs, interim history, self-report of stressors and symptoms, applications of prior therapy, status changes, and interventions made in session) Looking forward to getting back to work in-person.  Stressful month with contingent sale of his house and not being able to have a place to go that would enable him to set closing date with a buyer.  Big development that his 50yo mA Peter Congo was diagnosed with widespread, inoperable organ cancer, with about a 23-month prognosis.  Provocative for him as she is one of the women who raised him.  Hx lived with her and GM as an infant, GM was legal guardian, mother left the house for good with her next relationship about age 74 and he did not live with or answer to his biological mother until 33yo, when GM relocated to senior living, and he moved in with M and her 2nd H.  Biological M has effectively been more of a sister or cousin, and Peter Congo both an aunt and a mother figure.  Visiting her actively, now in a rehab facility with Hospice support.  She is still alert and not particularly frail, but her energy ebbing.    Discussed possibility of messages to give/receive with her.  No regrets, know of each other's love and appreciation.    One wrinkle in the story is that his cousin Ron, Gloria's son (effectively PT's brother growing up) has had a known drug habit and was a well-known homeless figure in [place] until he went missing.  Last seen Sept 2019, reported December, due to his prior pattern of being out of touch for weeks at a time.  Re. anxiety, continues to use breathing exercises to calm and his overall fitness habit for resiliency against panic  attacks.  Therapeutic modalities: Cognitive Behavioral Therapy, Solution-Oriented/Positive Psychology and Grief Therapy  Mental Status/Observations:  Appearance:   Casual and Neat     Behavior:  Appropriate  Motor:  Normal  Speech/Language:   Clear and Coherent  Affect:  Appropriate  Mood:  normal  Thought process:  normal  Thought content:    WNL  Sensory/Perceptual disturbances:    WNL  Orientation:  Fully oriented  Attention:  Good    Concentration:  Good  Memory:  WNL  Insight:    Good  Judgment:   Good  Impulse Control:  Good   Risk Assessment: Danger to Self: No Self-injurious Behavior: No Danger to Others: No Physical Aggression / Violence: No Duty to Warn: No Access to Firearms a concern: No  Assessment of progress:  progressing  Diagnosis:   ICD-10-CM   1. Anticipatory grief  F43.20   2. Panic disorder with agoraphobia  F40.01   3. Social phobia involving fear of public speaking  Q46.96    Plan:  . Consider if any messages, thank yous, memories to write down, or tribute for aunt Peter Congo . Continue use of breathing techniques for anxiety management and coping with anxiety exposures  . Other recommendations/advice as may be noted above . Continue to utilize previously learned skills ad lib . Maintain medication as prescribed and work faithfully with relevant prescriber(s) if any changes are desired or seem indicated . Call the clinic on-call  service, present to ER, or call 911 if any life-threatening psychiatric crisis Return in about 1 month (around 05/06/2020). . Already scheduled visit in this office 05/04/2020.  Blanchie Serve, PhD Luan Moore, PhD LP Clinical Psychologist, [facility] Group [facility] [address] [place], [place] [address] [phone]

## 2020-04-21 DIAGNOSIS — R7303 Prediabetes: Secondary | ICD-10-CM | POA: Diagnosis not present

## 2020-04-21 DIAGNOSIS — E039 Hypothyroidism, unspecified: Secondary | ICD-10-CM | POA: Diagnosis not present

## 2020-04-25 ENCOUNTER — Encounter (INDEPENDENT_AMBULATORY_CARE_PROVIDER_SITE_OTHER): Payer: Self-pay | Admitting: Otolaryngology

## 2020-04-25 ENCOUNTER — Ambulatory Visit (INDEPENDENT_AMBULATORY_CARE_PROVIDER_SITE_OTHER): Payer: BC Managed Care – PPO | Admitting: Otolaryngology

## 2020-04-25 ENCOUNTER — Other Ambulatory Visit: Payer: Self-pay

## 2020-04-25 VITALS — Temp 97.9°F

## 2020-04-25 DIAGNOSIS — H6123 Impacted cerumen, bilateral: Secondary | ICD-10-CM

## 2020-04-25 DIAGNOSIS — J310 Chronic rhinitis: Secondary | ICD-10-CM | POA: Diagnosis not present

## 2020-04-25 NOTE — Progress Notes (Signed)
HPI: Earl Hart is a 49 y.o. male who returns today for evaluation of tonsils, nose and ears.  He complains of tinnitus in both ears.  He has had occasional tonsil problems with sore throats but is doing better today.  He uses Flonase and Zyrtec for his nasal sinus symptoms.  He is breathing reasonably well today.  He is feeling better today than when he made the appointment. He apparently had a total thyroidectomy performed with Armandina Gemma several years ago because of large thyroid nodules and has done well since this was removed.  He is on replacement Synthroid..  Past Medical History:  Diagnosis Date  . Anxiety   . H. pylori infection   . Hypertension   . Multiple thyroid nodules   . Thyroid disease    surgery planned 09/2019, with surgeon stated tracheal deviation    Past Surgical History:  Procedure Laterality Date  . COLONOSCOPY  08/16/2019  . THYROIDECTOMY N/A 09/16/2019   Procedure: TOTAL THYROIDECTOMY;  Surgeon: Armandina Gemma, MD;  Location: [facility];  Service: General;  Laterality: N/A;  . UPPER GASTROINTESTINAL ENDOSCOPY  08/16/2019   Social History   Socioeconomic History  . Marital status: Single    Spouse name: Not on file  . Number of children: Not on file  . Years of education: Not on file  . Highest education level: Not on file  Occupational History  . Not on file  Tobacco Use  . Smoking status: Never Smoker  . Smokeless tobacco: Never Used  Vaping Use  . Vaping Use: Never used  Substance and Sexual Activity  . Alcohol use: No  . Drug use: Never  . Sexual activity: Not on file  Other Topics Concern  . Not on file  Social History Narrative  . Not on file   Social Determinants of Health   Financial Resource Strain:   . Difficulty of Paying Living Expenses:   Food Insecurity:   . Worried About Charity fundraiser in the Last Year:   . Arboriculturist in the Last Year:   Transportation Needs:   . Film/video editor (Medical):   Marland Kitchen Lack of  Transportation (Non-Medical):   Physical Activity:   . Days of Exercise per Week:   . Minutes of Exercise per Session:   Stress:   . Feeling of Stress :   Social Connections:   . Frequency of Communication with Friends and Family:   . Frequency of Social Gatherings with Friends and Family:   . Attends Religious Services:   . Active Member of Clubs or Organizations:   . Attends Archivist Meetings:   Marland Kitchen Marital Status:    Family History  Problem Relation Age of Onset  . Hypertension Mother   . Hypertension Father   . Esophageal cancer Neg Hx   . Pancreatic cancer Neg Hx   . Stomach cancer Neg Hx   . Rectal cancer Neg Hx   . Liver disease Neg Hx   . Liver cancer Neg Hx   . Colon cancer Neg Hx   . Colon polyps Neg Hx    Allergies  Allergen Reactions  . Lisinopril-Hydrochlorothiazide   . Penicillins Rash    Did it involve swelling of the face/tongue/throat, SOB, or low BP? No Did it involve sudden or severe rash/hives, skin peeling, or any reaction on the inside of your mouth or nose? No Did you need to seek medical attention at a hospital or doctor's office? Yes  When did it last happen?in your 30s If all above answers are "NO", may proceed with cephalosporin use.    Prior to Admission medications   Medication Sig Start Date End Date Taking? Authorizing Provider  amLODipine (NORVASC) 10 MG tablet Take 10 mg by mouth daily. 03/10/17  Yes [provider]  calcium carbonate (TUMS) 500 MG chewable tablet Chew 2 tablets (400 mg of elemental calcium total) by mouth 2 (two) times daily. 09/17/19  Yes GerkinSherren Mocha, MD  colestipol (COLESTID) 1 g tablet TAKE 1 TABLET (1 G TOTAL) BY MOUTH 2 (TWO) TIMES DAILY. 11/15/19  Yes Nandigam, Venia Minks, MD  FLUoxetine (PROZAC WEEKLY) 90 MG DR capsule Take 90 mg by mouth every 7 (seven) days. On Mondays   Yes [provider]  hydrocortisone (ANUSOL-HC) 25 MG suppository PLACE 1 SUPPOSITORY (25 MG TOTAL) RECTALLY AT  BEDTIME. USE FOR 5 TO 7 DAYS 11/11/19  Yes Nandigam, Venia Minks, MD  hyoscyamine (LEVSIN SL) 0.125 MG SL tablet TAKE 1 TABLET BY MOUTH THREE TIMES A DAY AS NEEDED 11/18/19  Yes Nandigam, Venia Minks, MD  levothyroxine (SYNTHROID) 125 MCG tablet Take 1 tablet (125 mcg total) by mouth daily before breakfast. 09/17/19 09/16/20 Yes Gerkin, Sherren Mocha, MD  Multiple Vitamins-Minerals (MULTIVITAMIN WITH MINERALS) tablet Take 1 tablet by mouth daily.   Yes [provider]  nebivolol (BYSTOLIC) 10 MG tablet Take 10 mg by mouth daily.   Yes [provider]  omeprazole (PRILOSEC) 40 MG capsule Take 1 capsule (40 mg total) by mouth daily. Patient taking differently: Take 40 mg by mouth daily as needed (heart burn).  08/16/19  Yes Nandigam, Venia Minks, MD  Probiotic Product (PROBIOTIC DAILY PO) Take 1 capsule by mouth daily.   Yes [provider]  traMADol (ULTRAM) 50 MG tablet Take 1-2 tablets (50-100 mg total) by mouth every 6 (six) hours as needed. 09/17/19  Yes Armandina Gemma, MD  vitamin C (ASCORBIC ACID) 500 MG tablet Take 500 mg by mouth daily.   Yes [provider]     Positive ROS: Otherwise negative  All other systems have been reviewed and were otherwise negative with the exception of those mentioned in the HPI and as above.  Physical Exam: Constitutional: Alert, well-appearing, no acute distress Ears: External ears without lesions or tenderness.  He had a mild amount of wax buildup in both ears that was cleaned with curette and suction.  The TMs were clear bilaterally with good mobility on pneumatic otoscopy with no middle ear effusion noted.  On hearing screen with the 512 1024 tuning fork he had just minimal hearing loss with AC > BC bilaterally. Nasal: External nose without lesions. Septum deviated to the right with moderate rhinitis and clear mucus discharge.  No polyps and no signs of infection.. Clear nasal passages Oral: Lips and gums without lesions. Tongue and palate  mucosa without lesions. Posterior oropharynx clear.  Tonsils are small and symmetric bilaterally with no exudate. Neck: No palpable adenopathy or masses.  Patient status post thyroidectomy with well-healed scar and no palpable adenopathy in the neck. Respiratory: Breathing comfortably  Skin: No facial/neck lesions or rash noted.  Cerumen impaction removal  Date/Time: 04/25/2020 5:23 PM Performed by: Rozetta Nunnery, MD Authorized by: Rozetta Nunnery, MD   Consent:    Consent obtained:  Verbal   Consent given by:  Patient   Risks discussed:  Pain and bleeding Procedure details:    Location:  L ear and R ear   Procedure  type: curette and suction   Post-procedure details:    Inspection:  TM intact and canal normal   Hearing quality:  Improved   Patient tolerance of procedure:  Tolerated well, no immediate complications Comments:     TMs are clear bilaterally.    Assessment: Chronic rhinitis would recommend continuation with the Flonase and Zyrtec. Cerumen buildup bilaterally. Tinnitus.  Plan: Discussed with patient concerning the tinnitus if this is bothersome or gets worse would recommend obtaining audiologic testing.  He was not scheduled for audiologic testing in the office today. Reassured him of normal upper airway examination otherwise and normal tonsil examination.  He will follow-up as needed.   Radene Journey, MD

## 2020-04-26 DIAGNOSIS — K911 Postgastric surgery syndromes: Secondary | ICD-10-CM | POA: Diagnosis not present

## 2020-05-03 DIAGNOSIS — R7303 Prediabetes: Secondary | ICD-10-CM | POA: Diagnosis not present

## 2020-05-03 DIAGNOSIS — Z9009 Acquired absence of other part of head and neck: Secondary | ICD-10-CM | POA: Diagnosis not present

## 2020-05-03 DIAGNOSIS — Z8639 Personal history of other endocrine, nutritional and metabolic disease: Secondary | ICD-10-CM | POA: Diagnosis not present

## 2020-05-03 DIAGNOSIS — E039 Hypothyroidism, unspecified: Secondary | ICD-10-CM | POA: Diagnosis not present

## 2020-05-04 ENCOUNTER — Ambulatory Visit (INDEPENDENT_AMBULATORY_CARE_PROVIDER_SITE_OTHER): Payer: BC Managed Care – PPO | Admitting: Psychiatry

## 2020-05-04 ENCOUNTER — Other Ambulatory Visit: Payer: Self-pay

## 2020-05-04 DIAGNOSIS — F4010 Social phobia, unspecified: Secondary | ICD-10-CM

## 2020-05-04 DIAGNOSIS — F4001 Agoraphobia with panic disorder: Secondary | ICD-10-CM

## 2020-05-04 NOTE — Progress Notes (Signed)
Psychotherapy Progress Note [facility] Earl Moore, PhD LP  Patient ID: Earl Hart     MRN: [id number] Therapy format: Individual psychotherapy Date: 05/04/2020      Start: 3:16p     Stop: 4:01p     Time Spent: 45 min Location: In-person   Session narrative (presenting needs, interim history, self-report of stressors and symptoms, applications of prior therapy, status changes, and interventions made in session) Earl Hart still living, still declining, but more gradually.  Now in a nursing facility, since she perked back up too much to be so imminently terminal as to pass at [facility] facility.  Still clear that he and Earl Hart have accounts settled emotionally and spiritually, no regrets or regrets in the making..    This month better for anxiety and stress overall.  Work Youth worker.  Mom calmer, though she can still be a mental drain, tends to gossip actively and talk at length.  Vacation planned, see friends in [place].  Continues to practice stress breaks, breathing technique, maintaining attitude that symptoms are temporary and can pass without agonizing.  Bowel distress can still happen and can be disturbing, but seeing it more tied into autonomic alarm response and amenable to combination of medical treatment and skillbuilding with relaxation.  Encouraged in continued relaxation practice.    Discussed anxiety in social situations, differentiating performance anxiety, minority anxiety, and more basic instinctual "fear of the herd" experienced in groups in various settings from waiting at [facility] to meeting rooms at the office.  None debilitating, more inconvenient, and risk of being noticed sweating, or possible bowel disruption.  Oriented to imaginal desensitization, outlining a process of taking time to settle and relax, turn on his imagination for a social/crowd scenario of choice, vividly imagine being there for a couple minutes, mentally "freeze" the action when  needed but stay present, and reapply relaxation skill as needed.  Alternatively or adjunctively, can also use observation games to change the kind of attention paid to other people (e.g., who is wearing blue, who might have shoplifted, etc.).  Therapeutic modalities: Cognitive Behavioral Therapy and Solution-Oriented/Positive Psychology  Mental Status/Observations:  Appearance:   Casual and Neat     Behavior:  Appropriate  Motor:  Normal  Speech/Language:   Clear and Coherent  Affect:  Appropriate and more responsive  Mood:  normal  Thought process:  normal  Thought content:    WNL  Sensory/Perceptual disturbances:    WNL  Orientation:  Fully oriented  Attention:  Good    Concentration:  Good  Memory:  WNL  Insight:    Good  Judgment:   Good  Impulse Control:  Good   Risk Assessment: Danger to Self: No Self-injurious Behavior: No Danger to Others: No Physical Aggression / Violence: No Duty to Warn: No Access to Firearms a concern: No  Assessment of progress:  progressing well  Diagnosis:   ICD-10-CM   1. Panic disorder with agoraphobia  F40.01   2. Social phobia involving fear of public speaking  S96.28    Plan:  . Continue use of relaxation techniques . Try imaginal desensitization for uncomfortable social exposures . Option try observation games to change attention during public exposure . Seek to apply relaxation response to situations ad lib . Other recommendations/advice as may be noted above . Continue to utilize previously learned skills ad lib . Maintain medication as prescribed and work faithfully with relevant prescriber(s) if any changes are desired or seem indicated . Call the clinic on-call  service, present to ER, or call 911 if any life-threatening psychiatric crisis Return in about 1 month (around 06/04/2020). . Already scheduled visit in this office 06/08/2020.  Blanchie Serve, PhD Earl Moore, PhD LP Clinical Psychologist, [facility]  Group [facility] [address] [place], [place] [address] [phone]

## 2020-06-08 ENCOUNTER — Ambulatory Visit (INDEPENDENT_AMBULATORY_CARE_PROVIDER_SITE_OTHER): Payer: BC Managed Care – PPO | Admitting: Psychiatry

## 2020-06-08 ENCOUNTER — Other Ambulatory Visit: Payer: Self-pay

## 2020-06-08 DIAGNOSIS — F4001 Agoraphobia with panic disorder: Secondary | ICD-10-CM | POA: Diagnosis not present

## 2020-06-08 DIAGNOSIS — F4010 Social phobia, unspecified: Secondary | ICD-10-CM | POA: Diagnosis not present

## 2020-06-08 NOTE — Progress Notes (Signed)
Psychotherapy Progress Note [facility] Luan Moore, PhD LP  Patient ID: Earl Hart     MRN: [id number] Therapy format: Individual psychotherapy Date: 06/08/2020      Start: 3:18p     Stop: 4:02p     Time Spent: 44 min Location: In-person   Session narrative (presenting needs, interim history, self-report of stressors and symptoms, applications of prior therapy, status changes, and interventions made in session) Decent month, no panic attacks, no significant attacks of anxiety, just some moments feeling sweats and overwhelmed.  Has been using the observation game idea to get outside of obsessive thinking about anxiety.  Serving well to pull the plug on mounting anxiety in uncomfortable situations.  Continuing background stress from mother depending too much on him and the continuing search for a better home in a market where listings get sold very quickly.  Work is "crazy busy", some hope of getting personnel up to meet the load.  Patience can wear thin some with family, and with his slow-talking friend.  No issues on the clock, but outside of that.  "Patience" moments he thinks of really sound more like matters of principle, on reflection, too important not to be firm with others, and,as recalled, sounds like appropriately assertive language.  Affirmed and encouraged.  Agreed to continue monthly for general stress management, personal support, and tactical improvements in self-care for anxiety and interpersonal stress.  Therapeutic modalities: Cognitive Behavioral Therapy and Solution-Oriented/Positive Psychology  Mental Status/Observations:  Appearance:   Casual     Behavior:  Appropriate  Motor:  Normal  Speech/Language:   Clear and Coherent  Affect:  Appropriate  Mood:  normal  Thought process:  normal  Thought content:    WNL  Sensory/Perceptual disturbances:    WNL  Orientation:  Fully oriented  Attention:  Good    Concentration:  Good  Memory:  WNL   Insight:    Good  Judgment:   Good  Impulse Control:  Good   Risk Assessment: Danger to Self: No Self-injurious Behavior: No Danger to Others: No Physical Aggression / Violence: No Duty to Warn: No Access to Firearms a concern: No  Assessment of progress:  progressing well  Diagnosis:   ICD-10-CM   1. Panic disorder with agoraphobia  F40.01   2. Social phobia involving fear of public speaking  T02.40    Plan:  . Continue use of relaxation techniques,  . Continuing option to use imaginal exposure and imaginal practice techniques for anxiogenic social situations . Endorsement it s OK to ask loved ones to limit some kinds of communication . Other recommendations/advice as may be noted above . Continue to utilize previously learned skills ad lib . Maintain medication as prescribed and work faithfully with relevant prescriber(s) if any changes are desired or seem indicated . Call the clinic on-call service, present to ER, or call 911 if any life-threatening psychiatric crisis Return in about 1 month (around 07/09/2020) for session(s) already scheduled. . Already scheduled visit in this office 07/13/2020.  Blanchie Serve, PhD Luan Moore, PhD LP Clinical Psychologist, [facility] Group [facility] [address] [place], [place] [address] [phone]

## 2020-07-13 ENCOUNTER — Ambulatory Visit: Payer: BC Managed Care – PPO | Admitting: Psychiatry

## 2020-08-03 ENCOUNTER — Ambulatory Visit: Payer: BC Managed Care – PPO | Admitting: Psychiatry

## 2020-08-04 ENCOUNTER — Ambulatory Visit: Payer: BC Managed Care – PPO | Admitting: Psychiatry

## 2020-08-04 DIAGNOSIS — R7303 Prediabetes: Secondary | ICD-10-CM | POA: Diagnosis not present

## 2020-08-04 DIAGNOSIS — E039 Hypothyroidism, unspecified: Secondary | ICD-10-CM | POA: Diagnosis not present

## 2020-08-08 DIAGNOSIS — M542 Cervicalgia: Secondary | ICD-10-CM | POA: Diagnosis not present

## 2020-08-08 DIAGNOSIS — K219 Gastro-esophageal reflux disease without esophagitis: Secondary | ICD-10-CM | POA: Diagnosis not present

## 2020-08-08 DIAGNOSIS — H6983 Other specified disorders of Eustachian tube, bilateral: Secondary | ICD-10-CM | POA: Diagnosis not present

## 2020-08-09 DIAGNOSIS — Z9009 Acquired absence of other part of head and neck: Secondary | ICD-10-CM | POA: Diagnosis not present

## 2020-08-09 DIAGNOSIS — E039 Hypothyroidism, unspecified: Secondary | ICD-10-CM | POA: Diagnosis not present

## 2020-08-09 DIAGNOSIS — Z8639 Personal history of other endocrine, nutritional and metabolic disease: Secondary | ICD-10-CM | POA: Diagnosis not present

## 2020-08-09 DIAGNOSIS — R7303 Prediabetes: Secondary | ICD-10-CM | POA: Diagnosis not present

## 2020-08-16 ENCOUNTER — Other Ambulatory Visit: Payer: Self-pay | Admitting: Physician Assistant

## 2020-08-16 DIAGNOSIS — M542 Cervicalgia: Secondary | ICD-10-CM

## 2020-08-23 ENCOUNTER — Ambulatory Visit (INDEPENDENT_AMBULATORY_CARE_PROVIDER_SITE_OTHER): Payer: BC Managed Care – PPO | Admitting: Psychiatry

## 2020-08-23 ENCOUNTER — Other Ambulatory Visit: Payer: Self-pay

## 2020-08-23 DIAGNOSIS — Z634 Disappearance and death of family member: Secondary | ICD-10-CM | POA: Diagnosis not present

## 2020-08-23 DIAGNOSIS — F4001 Agoraphobia with panic disorder: Secondary | ICD-10-CM | POA: Diagnosis not present

## 2020-08-23 DIAGNOSIS — K929 Disease of digestive system, unspecified: Secondary | ICD-10-CM | POA: Diagnosis not present

## 2020-08-23 DIAGNOSIS — F4010 Social phobia, unspecified: Secondary | ICD-10-CM

## 2020-08-23 NOTE — Progress Notes (Signed)
Psychotherapy Progress Note [facility] Luan Moore, PhD LP  Patient ID: Earl Hart     MRN: [id number] Therapy format: Individual psychotherapy Date: 08/23/2020      Start: 3:20p     Stop: 4:05p     Time Spent: 45 min Location: In-person   Session narrative (presenting needs, interim history, self-report of stressors and symptoms, applications of prior therapy, status changes, and interventions made in session) Put off last two scheduled visits with funerals, including Julieta Gutting 2023-07-17.  Friend passed away of MI 3 weeks ago, age 50, had just seen him 2 days earlier.  Condolences offered.  Other than that, hanging in, working hard, some stress at work, looking forward to time off and seasonal gearing down of his demands.  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  Denies significant effect of anxiety on work.  Anticipates a mass movement back to the office January.  Continues to practice breathing technique effectively.  No anxiety attacks.  May get short with a few people, but it's about boundary issues they have.  Has gotten his mother to tone down intrusive questions and blurting things out. Sees her getting some evening anxiety, probably related to her having somewhat too much alone time on her hands.  Notes he has history of being told he's impatient, knows he can snap down a bit to keep from hearing nonsense.    Has effectively given up a moderately strong caffeine habit -- green tea in the morning, a liter of diet sodas a day.  Went cold [place], had 2 weeks of sluggishness and headache before full detox, noting it was a 25-year habit before that.  Affirmed and encouraged in limiting the stimulant and enjoying the benefits.  Therapeutic modalities: Cognitive Behavioral Therapy and Solution-Oriented/Positive Psychology  Mental Status/Observations:  Appearance:   Casual     Behavior:  Appropriate  Motor:  Normal   Speech/Language:   Clear and Coherent  Affect:  Appropriate  Mood:  normal  Thought process:  normal  Thought content:    WNL  Sensory/Perceptual disturbances:    WNL  Orientation:  Fully oriented  Attention:  Good    Concentration:  Good  Memory:  WNL  Insight:    Good  Judgment:   Good  Impulse Control:  Good   Risk Assessment: Danger to Self: No Self-injurious Behavior: No Danger to Others: No Physical Aggression / Violence: No Duty to Warn: No Access to Firearms a concern: No  Assessment of progress:  progressing well  Diagnosis:   ICD-10-CM   1. Panic disorder with agoraphobia  F40.01   2. Social anxiety disorder  F40.10   3. Digestive problems - r/o SIBO  K92.9   4. Bereavement  Z63.4    Plan:  . Continue use of self-soothing skills . Continue reduced or abstinent caffeine  . Continue assertive communication as needed with family members  . Other recommendations/advice as may be noted above . Continue to utilize previously learned skills ad lib . Maintain medication as prescribed and work faithfully with relevant prescriber(s) if any changes are desired or seem indicated . Call the clinic on-call service, present to ER, or call 911 if any life-threatening psychiatric crisis Return in about 1 month (around 09/22/2020). . Already scheduled visit in this office Visit date not found.  Blanchie Serve, PhD Luan Moore, PhD LP Clinical Psychologist, [facility] [id number]  [place], [place] [address] ([phone]

## 2020-09-01 ENCOUNTER — Ambulatory Visit
Admission: RE | Admit: 2020-09-01 | Discharge: 2020-09-01 | Disposition: A | Discharge status: Home / Self Care | Payer: BC Managed Care – PPO | Source: Ambulatory Visit | Attending: Physician Assistant | Admitting: Physician Assistant

## 2020-09-01 ENCOUNTER — Other Ambulatory Visit: Payer: Self-pay

## 2020-09-01 DIAGNOSIS — M542 Cervicalgia: Secondary | ICD-10-CM | POA: Diagnosis not present

## 2020-09-01 MED ORDER — IOPAMIDOL (ISOVUE-300) INJECTION 61%
75.0000 mL | Freq: Once | INTRAVENOUS | Status: AC | PRN
  Administered 2020-09-01: 75 mL via INTRAVENOUS

## 2020-09-20 DIAGNOSIS — J321 Chronic frontal sinusitis: Secondary | ICD-10-CM | POA: Diagnosis not present

## 2020-09-20 DIAGNOSIS — J342 Deviated nasal septum: Secondary | ICD-10-CM | POA: Diagnosis not present

## 2020-09-20 DIAGNOSIS — J328 Other chronic sinusitis: Secondary | ICD-10-CM | POA: Diagnosis not present

## 2020-09-20 DIAGNOSIS — J3489 Other specified disorders of nose and nasal sinuses: Secondary | ICD-10-CM | POA: Diagnosis not present

## 2020-09-20 DIAGNOSIS — J341 Cyst and mucocele of nose and nasal sinus: Secondary | ICD-10-CM | POA: Diagnosis not present

## 2020-10-04 ENCOUNTER — Other Ambulatory Visit: Payer: Self-pay

## 2020-10-04 ENCOUNTER — Ambulatory Visit (INDEPENDENT_AMBULATORY_CARE_PROVIDER_SITE_OTHER): Payer: BC Managed Care – PPO | Admitting: Psychiatry

## 2020-10-04 DIAGNOSIS — F4010 Social phobia, unspecified: Secondary | ICD-10-CM

## 2020-10-04 DIAGNOSIS — K929 Disease of digestive system, unspecified: Secondary | ICD-10-CM

## 2020-10-04 DIAGNOSIS — F4001 Agoraphobia with panic disorder: Secondary | ICD-10-CM | POA: Diagnosis not present

## 2020-10-04 NOTE — Progress Notes (Signed)
Psychotherapy Progress Note [facility] Luan Moore, PhD LP  Patient ID: PRABHAV FAULKENBERRY     MRN: [id number] Therapy format: Individual psychotherapy Date: 10/04/2020      Start: 1:11p     Stop: 2:00p     Time Spent: 49 min Location: In-person   Session narrative (presenting needs, interim history, self-report of stressors and symptoms, applications of prior therapy, status changes, and interventions made in session) Doing OK, a few bouts of tension/anxiety, occ. feeling overwhelmed w/o panic.  Breathing exercises continue to help, as does physical exercise.  Long hours this season, and some hope of new hire helping take off workload.  Can still have ANS reactions to crowds, no identifiable worry but reactive nervous system.  Is on Prozac longterm (weekly dose, been on since age 49), beta blocker, and now thyroid hormone since thyroidectomy.  Discussed possibility of SSRI poop-out, but also making relaxation a regular habit, e.g., through yoga or meditation.    Therapeutic modalities: Cognitive Behavioral Therapy and Solution-Oriented/Positive Psychology  Mental Status/Observations:  Appearance:   Casual     Behavior:  Appropriate  Motor:  Normal  Speech/Language:   Clear and Coherent  Affect:  Appropriate  Mood:  normal  Thought process:  normal  Thought content:    WNL  Sensory/Perceptual disturbances:    WNL  Orientation:  Fully oriented  Attention:  Good    Concentration:  Good  Memory:  WNL  Insight:    Good  Judgment:   Good  Impulse Control:  Good   Risk Assessment: Danger to Self: No Self-injurious Behavior: No Danger to Others: No Physical Aggression / Violence: No Duty to Warn: No Access to Firearms a concern: No  Assessment of progress:  progressing  Diagnosis:   ICD-10-CM   1. Panic disorder with agoraphobia  F40.01   2. Social anxiety disorder  F40.10   3. Digestive problems - r/o SIBO  K92.9    Plan:  . Continue calming skills,  exercise, and caffeine control . Intensify practice of physical disciplines (exercise, yoga) for overall health and anxiety control . Assess SSRI poop-out as desired.  May refer to psychiatry in house if seeking expert opinion. Marland Kitchen Option go further in to interoceptive desensitization . Other recommendations/advice as may be noted above . Continue to utilize previously learned skills ad lib . Maintain medication as prescribed and work faithfully with relevant prescriber(s) if any changes are desired or seem indicated . Call the clinic on-call service, present to ER, or call 911 if any life-threatening psychiatric crisis Return in about 1 month (around 11/04/2020). . Already scheduled visit in this office 11/01/2020.  Blanchie Serve, PhD Luan Moore, PhD LP Clinical Psychologist, [facility] [address], [place], [place] [address] [phone]

## 2020-11-01 ENCOUNTER — Ambulatory Visit: Payer: BC Managed Care – PPO | Admitting: Psychiatry

## 2020-11-07 ENCOUNTER — Telehealth: Payer: Self-pay | Admitting: Gastroenterology

## 2020-11-07 NOTE — Telephone Encounter (Signed)
Ok to send refill for Anusol. Thanks

## 2020-11-07 NOTE — Telephone Encounter (Signed)
Patient called and states his hemorrhoids have gotten much worse. Scheduled an office visit with Dr. Silverio Decamp on 11/14/20, but patient would like a refill sent in for his Anusol supp. In the meantime. Please advise

## 2020-11-07 NOTE — Telephone Encounter (Signed)
Inbound call from the patient stating for the past 3 weeks he has had a flare up with his hemorrhoids and is wanting to know if something can be sent to the pharmacy for it since he has tried over the counter medication but they do not help.  I did inform him of next appt availability which is end of February but does not want to wait that long.  Please advise.

## 2020-11-08 ENCOUNTER — Other Ambulatory Visit: Payer: Self-pay

## 2020-11-08 MED ORDER — HYDROCORTISONE ACETATE 25 MG RE SUPP: 25.0000 mg | Freq: Every day | RECTAL | 0 refills | Status: DC

## 2020-11-08 NOTE — Progress Notes (Signed)
a 

## 2020-11-08 NOTE — Telephone Encounter (Signed)
Anusol supp.refill sent to patient's pharmacy and patient notified

## 2020-11-14 ENCOUNTER — Ambulatory Visit: Payer: 59 | Admitting: Gastroenterology

## 2020-11-14 ENCOUNTER — Encounter: Payer: Self-pay | Admitting: Gastroenterology

## 2020-11-14 VITALS — BP 120/78 | HR 64 | Ht 71.0 in | Wt 203.8 lb

## 2020-11-14 DIAGNOSIS — K649 Unspecified hemorrhoids: Secondary | ICD-10-CM

## 2020-11-14 DIAGNOSIS — K641 Second degree hemorrhoids: Secondary | ICD-10-CM

## 2020-11-14 NOTE — Patient Instructions (Signed)
Take benefiber 1 tbs twice a day  HEMORRHOID BANDING PROCEDURE    FOLLOW-UP CARE   1. The procedure you have had should have been relatively painless since the banding of the area involved does not have nerve endings and there is no pain sensation.  The rubber band cuts off the blood supply to the hemorrhoid and the band may fall off as soon as 48 hours after the banding (the band may occasionally be seen in the toilet bowl following a bowel movement). You may notice a temporary feeling of fullness in the rectum which should respond adequately to plain Tylenol or Motrin.  2. Following the banding, avoid strenuous exercise that evening and resume full activity the next day.  A sitz bath (soaking in a warm tub) or bidet is soothing, and can be useful for cleansing the area after bowel movements.     3. To avoid constipation, take two tablespoons of natural wheat bran, natural oat bran, flax, Benefiber or any over the counter fiber supplement and increase your water intake to 7-8 glasses daily.    4. Unless you have been prescribed anorectal medication, do not put anything inside your rectum for two weeks: No suppositories, enemas, fingers, etc.  5. Occasionally, you may have more bleeding than usual after the banding procedure.  This is often from the untreated hemorrhoids rather than the treated one.  Don't be concerned if there is a tablespoon or so of blood.  If there is more blood than this, lie flat with your bottom higher than your head and apply an ice pack to the area. If the bleeding does not stop within a half an hour or if you feel faint, call our office at [phone] or go to the emergency room.  6. Problems are not common; however, if there is a substantial amount of bleeding, severe pain, chills, fever or difficulty passing urine (very rare) or other problems, you should call us at [phone] or report to the nearest emergency room.  7. Do not stay seated continuously for  more than 2-3 hours for a day or two after the procedure.  Tighten your buttock muscles 10-15 times every two hours and take 10-15 deep breaths every 1-2 hours.  Do not spend more than a few minutes on the toilet if you cannot empty your bowel; instead re-visit the toilet at a later time.    Due to recent changes in healthcare laws, you may see the results of your imaging and laboratory studies on MyChart before your provider has had a chance to review them.  We understand that in some cases there may be results that are confusing or concerning to you. Not all laboratory results come back in the same time frame and the provider may be waiting for multiple results in order to interpret others.  Please give [place] 48 hours in order for your provider to thoroughly review all the results before contacting the office for clarification of your results.   I appreciate the  opportunity to care for you  Thank You   Harl Bowie , MD

## 2020-11-14 NOTE — Progress Notes (Signed)
Earl Hart    [id number]    1971-10-08  Primary Care Physician:Tisovec, Fransico Him, MD  Referring Physician: Haywood Pao, MD [address] [place],  [place] [address]   Chief complaint:  Hemorrhoids  HPI:  50 yr old very pleasant male here to discuss management of symptomatic hemorrhoids. He has intermittent bleeding, anal itching and burning sensation with swelling of hemorrhoids.  He has some relief when he uses suppositories but his symptoms recur after some time.  Colonoscopy August 16, 2019: 2 small sessile hyperplastic polyps removed.  Internal hemorrhoids.  EGD August 16, 2019: Gastritis, biopsy negative for H. pylori infection, dysplasia or intestinal metaplasia   Outpatient Encounter Medications as of 11/14/2020  Medication Sig  . amLODipine (NORVASC) 10 MG tablet Take 10 mg by mouth daily.  Marland Kitchen FLUoxetine (PROZAC WEEKLY) 90 MG DR capsule Take 90 mg by mouth every 7 (seven) days. On Mondays  . hydrocortisone (ANUSOL-HC) 25 MG suppository Place 1 suppository (25 mg total) rectally at bedtime. Use for 5 to 7 days  . levothyroxine (SYNTHROID) 125 MCG tablet Take 1 tablet (125 mcg total) by mouth daily before breakfast.  . nebivolol (BYSTOLIC) 10 MG tablet Take 10 mg by mouth daily.  . [DISCONTINUED] calcium carbonate (TUMS) 500 MG chewable tablet Chew 2 tablets (400 mg of elemental calcium total) by mouth 2 (two) times daily.  . [DISCONTINUED] colestipol (COLESTID) 1 g tablet TAKE 1 TABLET (1 G TOTAL) BY MOUTH 2 (TWO) TIMES DAILY.  . [DISCONTINUED] hyoscyamine (LEVSIN SL) 0.125 MG SL tablet TAKE 1 TABLET BY MOUTH THREE TIMES A DAY AS NEEDED  . [DISCONTINUED] Multiple Vitamins-Minerals (MULTIVITAMIN WITH MINERALS) tablet Take 1 tablet by mouth daily.  . [DISCONTINUED] omeprazole (PRILOSEC) 40 MG capsule Take 1 capsule (40 mg total) by mouth daily. (Patient taking differently: No sig reported)  . [DISCONTINUED] Probiotic Product (PROBIOTIC DAILY PO) Take 1  capsule by mouth daily.  . [DISCONTINUED] traMADol (ULTRAM) 50 MG tablet Take 1-2 tablets (50-100 mg total) by mouth every 6 (six) hours as needed.  . [DISCONTINUED] vitamin C (ASCORBIC ACID) 500 MG tablet Take 500 mg by mouth daily.   No facility-administered encounter medications on file as of 11/14/2020.    Allergies as of 11/14/2020 - Review Complete 11/14/2020  Allergen Reaction Noted  . Lisinopril-hydrochlorothiazide  04/25/2020  . Penicillins Rash 07/06/2013    Past Medical History:  Diagnosis Date  . Anxiety   . H. pylori infection   . Hemorrhoids   . Hypertension   . Multiple thyroid nodules   . Thyroid disease    surgery planned 09/2019, with surgeon stated tracheal deviation     Past Surgical History:  Procedure Laterality Date  . COLONOSCOPY  08/16/2019  . THYROIDECTOMY N/A 09/16/2019   Procedure: TOTAL THYROIDECTOMY;  Surgeon: Armandina Gemma, MD;  Location: [facility];  Service: General;  Laterality: N/A;  . UPPER GASTROINTESTINAL ENDOSCOPY  08/16/2019    Family History  Problem Relation Age of Onset  . Hypertension Mother   . Hypertension Father   . Esophageal cancer Neg Hx   . Pancreatic cancer Neg Hx   . Stomach cancer Neg Hx   . Rectal cancer Neg Hx   . Liver disease Neg Hx   . Liver cancer Neg Hx   . Colon cancer Neg Hx   . Colon polyps Neg Hx     Social History   Socioeconomic History  . Marital status: Single  Spouse name: Not on file  . Number of children: Not on file  . Years of education: Not on file  . Highest education level: Not on file  Occupational History  . Not on file  Tobacco Use  . Smoking status: Never Smoker  . Smokeless tobacco: Never Used  Vaping Use  . Vaping Use: Never used  Substance and Sexual Activity  . Alcohol use: No  . Drug use: Never  . Sexual activity: Not on file  Other Topics Concern  . Not on file  Social History Narrative  . Not on file   Social Determinants of Health   Financial Resource Strain: Not  on file  Food Insecurity: Not on file  Transportation Needs: Not on file  Physical Activity: Not on file  Stress: Not on file  Social Connections: Not on file  Intimate Partner Violence: Not on file      Review of systems: All other review of systems negative except as mentioned in the HPI.   Physical Exam: Vitals:   11/14/20 0918  BP: 120/78  Pulse: 64   Body mass index is 28.42 kg/m. Gen:      No acute distress HEENT:  sclera anicteric Abd:      soft, non-tender; no palpable masses, no distension Ext:    No edema Neuro: alert and oriented x 3 Psych: normal mood and affect  Data Reviewed:  Reviewed labs, radiology imaging, old records and pertinent past GI work up   Assessment and Plan/Recommendations:  PROCEDURE NOTE: The patient presents with symptomatic grade II  hemorrhoids, requesting rubber band ligation of his/her hemorrhoidal disease.  All risks, benefits and alternative forms of therapy were described and informed consent was obtained.  In the Left Lateral Decubitus position anoscopic examination revealed grade II hemorrhoids in the Left lateral position(s).  The anorectum was pre-medicated with 0.125% NTG and Recticare The decision was made to band the Left lateral internal hemorrhoid, and the [place] was used to perform band ligation without complication.  Digital anorectal examination was then performed to assure proper positioning of the band, and to adjust the banded tissue as required.  The patient was discharged home without pain or other issues.  Dietary and behavioral recommendations were given and along with follow-up instructions.     The following adjunctive treatments were recommended:  Benefiber 1 tablespoon BID with meals  The patient will return in 2-4 weeks for  follow-up and possible additional banding as required. No complications were encountered and the patient tolerated the procedure well.   The patient was provided an  opportunity to ask questions and all were answered. The patient agreed with the plan and demonstrated an understanding of the instructions.  [name] , MD    CC: Tisovec, Fransico Him, MD

## 2020-12-08 ENCOUNTER — Ambulatory Visit (INDEPENDENT_AMBULATORY_CARE_PROVIDER_SITE_OTHER): Payer: 59 | Admitting: Psychiatry

## 2020-12-08 ENCOUNTER — Other Ambulatory Visit: Payer: Self-pay

## 2020-12-08 DIAGNOSIS — K929 Disease of digestive system, unspecified: Secondary | ICD-10-CM

## 2020-12-08 DIAGNOSIS — F4001 Agoraphobia with panic disorder: Secondary | ICD-10-CM | POA: Diagnosis not present

## 2020-12-08 DIAGNOSIS — F4010 Social phobia, unspecified: Secondary | ICD-10-CM | POA: Diagnosis not present

## 2020-12-08 NOTE — Progress Notes (Signed)
Psychotherapy Progress Note [facility] Earl Moore, PhD LP  Patient ID: Earl Hart     MRN: [id number] Therapy format: Individual psychotherapy Date: 12/08/2020      Start: 3:11p     Stop: 4:01p     Time Spent: 50 min Location: In-person   Session narrative (presenting needs, interim history, self-report of stressors and symptoms, applications of prior therapy, status changes, and interventions made in session) Long, busy days at work.  Some episodes of anxiety trying to rise.  Applying relaxation skills, breathing, walk breaks, exercise still helps.  Still working from home until early April, will be two full years remote.  Mostly looking forward to more social contact and change of scenery.  Continues anxiety with crowds, some with public places, especially barber shop and inconvenient sweating, some self-consciousness about it.  Able to still use mindfulness to break it up.  Discussed possibility of "channeling" any icons he may have of resiliency to social anxiety / fear of evaluation (e.g., Earl Hart, Earl Hart) and encouraged continuing practice at welcoming/allowing anxiety symptoms to happen and pass through.  Physical in January, all seems settled with SSRI, beta blocker, and thyroid hormone.  IBS sustained bette than it was, diet-controlled.  Yogurt and green vegetables have been helpful.  Got banding for hemorrhoids that developed after IBS began in 2020.  Has not engaged yoga yet -- will look into evening classes.    Family have been pretty well-behaved since holidays, more limited stress.  Family stress with suspected bipolar sister who declines treatment.  Bipolar brother (Earl Hart) suicided 8 years ago, still can have angry moments for how he didn't reach out for help.  New great-nephew a plus.  Asks about self-help material, recommended to [employer] for a range of applicable and accessible workbooks.  Therapeutic modalities:  Cognitive Behavioral Therapy and Solution-Oriented/Positive Psychology  Mental Status/Observations:  Appearance:   Casual and Neat     Behavior:  Appropriate  Motor:  Normal  Speech/Language:   Clear and Coherent  Affect:  Appropriate  Mood:  normal  Thought process:  normal  Thought content:    WNL  Sensory/Perceptual disturbances:    WNL  Orientation:  Fully oriented  Attention:  Good    Concentration:  Good  Memory:  WNL  Insight:    Good  Judgment:   Good  Impulse Control:  Good   Risk Assessment: Danger to Self: No Self-injurious Behavior: No Danger to Others: No Physical Aggression / Violence: No Duty to Warn: No Access to Firearms a concern: No  Assessment of progress:  progressing  Diagnosis:   ICD-10-CM   1. Panic disorder with agoraphobia  F40.01   2. Social phobia involving fear of public speaking  N36.14   3. Digestive problems - r/o SIBO  K92.9    Plan:  . Continue use of relaxation, mindful observation, and physical movement strategies for anxiety relief . Consider imagination games for self-consciousness -- channel a stress "hero", imagine symptoms exaggerated into superpowers . Continue orientation to accept rather than control involuntary anxiety symptoms  . Continuing option interoceptive desensitization exercises as provide before . Referral for yoga practitioner . Workbook options for self-help in anxiety/stress management . Other recommendations/advice as may be noted above . Continue to utilize previously learned skills ad lib . Maintain medication as prescribed and work faithfully with relevant prescriber(s) if any changes are desired or seem indicated . Call the clinic on-call service, present to ER, or  call 911 if any life-threatening psychiatric crisis Return for time at discretion. . Already scheduled visit in this office Visit date not found.  Blanchie Serve, PhD Earl Moore, PhD LP Clinical Psychologist, [facility]  Group [facility] [address], [place], [place] [address] [phone]

## 2020-12-13 ENCOUNTER — Ambulatory Visit: Payer: 59 | Admitting: Gastroenterology

## 2020-12-13 ENCOUNTER — Encounter: Payer: Self-pay | Admitting: Gastroenterology

## 2020-12-13 VITALS — BP 128/76 | HR 53 | Ht 71.0 in | Wt 207.0 lb

## 2020-12-13 DIAGNOSIS — K641 Second degree hemorrhoids: Secondary | ICD-10-CM

## 2020-12-13 NOTE — Patient Instructions (Signed)
HEMORRHOID BANDING PROCEDURE    FOLLOW-UP CARE   1. The procedure you have had should have been relatively painless since the banding of the area involved does not have nerve endings and there is no pain sensation.  The rubber band cuts off the blood supply to the hemorrhoid and the band may fall off as soon as 48 hours after the banding (the band may occasionally be seen in the toilet bowl following a bowel movement). You may notice a temporary feeling of fullness in the rectum which should respond adequately to plain Tylenol or Motrin.  2. Following the banding, avoid strenuous exercise that evening and resume full activity the next day.  A sitz bath (soaking in a warm tub) or bidet is soothing, and can be useful for cleansing the area after bowel movements.     3. To avoid constipation, take two tablespoons of natural wheat bran, natural oat bran, flax, Benefiber or any over the counter fiber supplement and increase your water intake to 7-8 glasses daily.    4. Unless you have been prescribed anorectal medication, do not put anything inside your rectum for two weeks: No suppositories, enemas, fingers, etc.  5. Occasionally, you may have more bleeding than usual after the banding procedure.  This is often from the untreated hemorrhoids rather than the treated one.  Don't be concerned if there is a tablespoon or so of blood.  If there is more blood than this, lie flat with your bottom higher than your head and apply an ice pack to the area. If the bleeding does not stop within a half an hour or if you feel faint, call our office at [phone] or go to the emergency room.  6. Problems are not common; however, if there is a substantial amount of bleeding, severe pain, chills, fever or difficulty passing urine (very rare) or other problems, you should call us at [phone] or report to the nearest emergency room.  7. Do not stay seated continuously for more than 2-3 hours for a day or two  after the procedure.  Tighten your buttock muscles 10-15 times every two hours and take 10-15 deep breaths every 1-2 hours.  Do not spend more than a few minutes on the toilet if you cannot empty your bowel; instead re-visit the toilet at a later time.    I appreciate the  opportunity to care for you  Thank You   Harl Bowie , MD

## 2020-12-13 NOTE — Progress Notes (Signed)
PROCEDURE NOTE: The patient presents with symptomatic grade II  hemorrhoids, requesting rubber band ligation of his/her hemorrhoidal disease.  All risks, benefits and alternative forms of therapy were described and informed consent was obtained.   The anorectum was pre-medicated with 0.125% NTG and Recticare The decision was made to band the Right posterior internal hemorrhoid, and the [facility] System was used to perform band ligation without complication.  Digital anorectal examination was then performed to assure proper positioning of the band, and to adjust the banded tissue as required.  The patient was discharged home without pain or other issues.  Dietary and behavioral recommendations were given and along with follow-up instructions.      The patient will return in 2-4 weeks for  follow-up and possible additional banding as required. No complications were encountered and the patient tolerated the procedure well.  K. Veena Nandigam , MD [phone]   

## 2020-12-15 ENCOUNTER — Other Ambulatory Visit: Payer: Self-pay | Admitting: Internal Medicine

## 2020-12-15 ENCOUNTER — Ambulatory Visit
Admission: RE | Admit: 2020-12-15 | Discharge: 2020-12-15 | Disposition: A | Discharge status: Home / Self Care | Payer: 59 | Source: Ambulatory Visit | Attending: Internal Medicine | Admitting: Internal Medicine

## 2020-12-15 DIAGNOSIS — R109 Unspecified abdominal pain: Secondary | ICD-10-CM

## 2021-01-05 ENCOUNTER — Ambulatory Visit (INDEPENDENT_AMBULATORY_CARE_PROVIDER_SITE_OTHER): Payer: 59 | Admitting: Psychiatry

## 2021-01-05 ENCOUNTER — Other Ambulatory Visit: Payer: Self-pay

## 2021-01-05 DIAGNOSIS — F4010 Social phobia, unspecified: Secondary | ICD-10-CM

## 2021-01-05 DIAGNOSIS — F4001 Agoraphobia with panic disorder: Secondary | ICD-10-CM

## 2021-01-05 DIAGNOSIS — Z634 Disappearance and death of family member: Secondary | ICD-10-CM | POA: Diagnosis not present

## 2021-01-05 NOTE — Progress Notes (Signed)
Psychotherapy Progress Note [facility] Luan Moore, PhD LP  Patient ID: Earl Hart     MRN: [id number] Therapy format: Individual psychotherapy Date: 01/05/2021      Start: 3:20p     Stop: 4:00p     Time Spent: 40 min Location: In-person   Session narrative (presenting needs, interim history, self-report of stressors and symptoms, applications of prior therapy, status changes, and interventions made in session) Still busy period at work.  Mass re-entry to the office after remote work on April 11. 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  Friend's brother d. today after a severe respiratory illness.  Will be going to visit friend later on.    Has done some Internet research on self-help for relaxation and mindfulness.  About 3/wk goes looking for helpful information, YouTube videos how others cope.  Feels he is still fluent in use of diaphragmatic breathing and focal point to calm a panic. Still seems to be a pure panic attack, adrenaline release, able to let it pass without catastrophizing, just use calming/coping skills until.   Affirmed continuing practice of coping skills, took up imagery exercise to improve hope of improvement and orientation to acclimating to his body's response, imagining 30 times of his choosing going back through an incident where he sweated self-consciously in front of people and excused himself, imagining how, with repetitions, he could speak matter of factly for what's going on, just get a paper towel, not have to exit the situation, and maintain how it is just a "false alarm" in his emotional brain.  Educated about the proposed evolutionary meaning of sweating response and reinterpreted what instinctive brain is up to, even modelling playful self-talk about what the survival brain is saying.  Reintroduced interoceptive desensitization as a tool for furthering his sense of resiliency  coping with inconvenient autonomic responses and redirected to workbook material previously provided.  Therapeutic modalities: Cognitive Behavioral Therapy, Solution-Oriented/Positive Psychology, Psycho-education/Bibliotherapy and Gestalt/Psychodrama  Mental Status/Observations:  Appearance:   Casual     Behavior:  Appropriate  Motor:  Normal  Speech/Language:   Clear and Coherent  Affect:  Appropriate  Mood:  normal  Thought process:  normal  Thought content:    WNL  Sensory/Perceptual disturbances:    WNL  Orientation:  Fully oriented  Attention:  Good    Concentration:  Good  Memory:  WNL  Insight:    Good  Judgment:   Good  Impulse Control:  Good   Risk Assessment: Danger to Self: No Self-injurious Behavior: No Danger to Others: No Physical Aggression / Violence: No Duty to Warn: No Access to Firearms a concern: No  Assessment of progress:  progressing  Diagnosis:   ICD-10-CM   1. Panic disorder with agoraphobia  F40.01   2. Social phobia involving fear of public speaking  Y70.62   3. Bereavement  Z63.4    Plan:  . Self-affirm evolutionary meaning of autonomic symptoms and reframe self-talk about it. Option to make it playful (e.g., "Look at me -- I'm too slippery for you predators!") . Imaginal practice speaking matter-of-factly for autonomic symptoms as a false alarm response he gets that will resolve itself in a few, and not having to excuse himself (but free to) . Look back into interoceptive desensitization material and see if exercises listed could work as tolerance challenges and trainer experiences (esp. those that promote sweat) . Other recommendations/advice as may  be noted above . Continue to utilize previously learned skills ad lib . Maintain medication as prescribed and work faithfully with relevant prescriber(s) if any changes are desired or seem indicated . Call the clinic on-call service, present to ER, or call 911 if any life-threatening psychiatric  crisis Return in about 1 month (around 02/05/2021). . Already scheduled visit in this office Visit date not found.  Blanchie Serve, PhD Luan Moore, PhD LP Clinical Psychologist, [facility] [address] [employer] [place], [place] [address] [phone]

## 2021-01-16 ENCOUNTER — Encounter: Payer: Self-pay | Admitting: Gastroenterology

## 2021-01-16 ENCOUNTER — Other Ambulatory Visit: Payer: Self-pay

## 2021-01-16 ENCOUNTER — Ambulatory Visit: Payer: 59 | Admitting: Gastroenterology

## 2021-01-16 VITALS — BP 134/84 | HR 56 | Ht 71.0 in | Wt 208.1 lb

## 2021-01-16 DIAGNOSIS — K641 Second degree hemorrhoids: Secondary | ICD-10-CM | POA: Diagnosis not present

## 2021-01-16 NOTE — Patient Instructions (Signed)
If you are age 51 or younger, your body mass index should be between 19-25. Your Body mass index is 29.03 kg/m. If this is out of the aformentioned range listed, please consider follow up with your Primary Care Provider.   You will follow up with our office on an as needed basis.  HEMORRHOID BANDING PROCEDURE    FOLLOW-UP CARE   1. The procedure you have had should have been relatively painless since the banding of the area involved does not have nerve endings and there is no pain sensation.  The rubber band cuts off the blood supply to the hemorrhoid and the band may fall off as soon as 48 hours after the banding (the band may occasionally be seen in the toilet bowl following a bowel movement). You may notice a temporary feeling of fullness in the rectum which should respond adequately to plain Tylenol or Motrin.  2. Following the banding, avoid strenuous exercise that evening and resume full activity the next day.  A sitz bath (soaking in a warm tub) or bidet is soothing, and can be useful for cleansing the area after bowel movements.     3. To avoid constipation, take two tablespoons of natural wheat bran, natural oat bran, flax, Benefiber or any over the counter fiber supplement and increase your water intake to 7-8 glasses daily.    4. Unless you have been prescribed anorectal medication, do not put anything inside your rectum for two weeks: No suppositories, enemas, fingers, etc.  5. Occasionally, you may have more bleeding than usual after the banding procedure.  This is often from the untreated hemorrhoids rather than the treated one.  Don't be concerned if there is a tablespoon or so of blood.  If there is more blood than this, lie flat with your bottom higher than your head and apply an ice pack to the area. If the bleeding does not stop within a half an hour or if you feel faint, call our office at [phone] or go to the emergency room.  6. Problems are not common; however,  if there is a substantial amount of bleeding, severe pain, chills, fever or difficulty passing urine (very rare) or other problems, you should call us at [phone] or report to the nearest emergency room.  7. Do not stay seated continuously for more than 2-3 hours for a day or two after the procedure.  Tighten your buttock muscles 10-15 times every two hours and take 10-15 deep breaths every 1-2 hours.  Do not spend more than a few minutes on the toilet if you cannot empty your bowel; instead re-visit the toilet at a later time.    I appreciate the opportunity to care for you. Thank you for choosing me and [facility],  Dr. Harl Bowie

## 2021-01-16 NOTE — Progress Notes (Signed)
PROCEDURE NOTE: The patient presents with symptomatic grade II hemorrhoids, requesting rubber band ligation of his/her hemorrhoidal disease.  All risks, benefits and alternative forms of therapy were described and informed consent was obtained.   The anorectum was pre-medicated with 0.125% NTG and Recticare The decision was made to band the Right anterior internal hemorrhoid, and the [place] was used to perform band ligation without complication.  Digital anorectal examination was then performed to assure proper positioning of the band, and to adjust the banded tissue as required.  The patient was discharged home without pain or other issues.  Dietary and behavioral recommendations were given and along with follow-up instructions.     The following adjunctive treatments were recommended: Continue fiber supplements and increased water intake  The patient will return for  follow-up and possible additional banding as needed. No complications were encountered and the patient tolerated the procedure well.  [name] , MD [phone]

## 2021-01-23 ENCOUNTER — Other Ambulatory Visit: Payer: Self-pay

## 2021-01-23 ENCOUNTER — Telehealth: Payer: Self-pay | Admitting: Gastroenterology

## 2021-01-23 MED ORDER — AMBULATORY NON FORMULARY MEDICATION: 0 refills | Status: DC

## 2021-01-23 NOTE — Telephone Encounter (Signed)
Doctor of the Day Patient of Dr Woodward Ku who had his 2nd banding procedure on 01/16/21. He calls today with complaints of sharp rectal pain with defecation. Denies any blood with bowel movement or after bowel movement. States this started last week after the banding. He did not have pain on the day of the procedure. Please advise. Thanks

## 2021-01-23 NOTE — Telephone Encounter (Signed)
Inbound call from patient stating his pain never went away after having his procedure on 01/16/21; states it has actually gotten worse.  Please advise.

## 2021-01-23 NOTE — Telephone Encounter (Signed)
It has been too many days since the procedure to make any adjustment on the band, and the band may actually have already fallen off by now.  This type of pain is usually caused by anal sphincter spasm.  I recommend ibuprofen or Tylenol as well as a prescription for 0.125% nitroglycerin ointment (compunded at [facility] per usual), to be applied 3-4 times a day as needed for the pain.  - HD

## 2021-01-23 NOTE — Telephone Encounter (Signed)
Discussed with the patient.  Rx to [facility].

## 2021-01-29 NOTE — Telephone Encounter (Signed)
Spoke with the patient. NTG is helping. Pain is no longer "sharp." He is on Benefiber. He will come in 01/08/21 at 3:20 for a check.

## 2021-01-29 NOTE — Telephone Encounter (Signed)
Beth, please advise him to use the NTG per rectum along with benefiber and have him come in for follow up office next week with me. Thanks

## 2021-02-08 ENCOUNTER — Ambulatory Visit: Payer: 59 | Admitting: Gastroenterology

## 2021-02-08 ENCOUNTER — Encounter: Payer: Self-pay | Admitting: Gastroenterology

## 2021-02-08 VITALS — BP 124/72 | HR 56 | Ht 71.0 in | Wt 206.6 lb

## 2021-02-08 DIAGNOSIS — K6289 Other specified diseases of anus and rectum: Secondary | ICD-10-CM

## 2021-02-08 DIAGNOSIS — K219 Gastro-esophageal reflux disease without esophagitis: Secondary | ICD-10-CM

## 2021-02-08 DIAGNOSIS — R1013 Epigastric pain: Secondary | ICD-10-CM

## 2021-02-08 MED ORDER — PANTOPRAZOLE SODIUM 40 MG PO TBEC: 40.0000 mg | DELAYED_RELEASE_TABLET | Freq: Two times a day (BID) | ORAL | 3 refills | Status: DC

## 2021-02-08 MED ORDER — HYDROCORTISONE ACETATE 25 MG RE SUPP: 25.0000 mg | Freq: Every day | RECTAL | 1 refills | Status: DC

## 2021-02-08 NOTE — Progress Notes (Signed)
Earl Hart    [id number]    1970/11/16  Primary Care Physician:Tisovec, Fransico Him, MD  Referring Physician: Haywood Pao, MD [address] [place],  [place] [address]   Chief complaint:  Rectal pain, GERD  HPI: 50 yr old very pleasant gentleman  s/p hemorrhoidal band ligation here for evaluation of rectal pain.  He had rectal pain and discomfort following last hemorrhoidal banding in April which has since resolved.  Denies any rectal bleeding or discomfort. He is no longer using nitroglycerin.  Is experiencing intermittent epigastric discomfort and acid reflux symptoms, currently not on any acid suppressive medication because of interaction with thyroid medication Denies any dysphagia, weight loss  Colonoscopy August 16, 2019: 2 small sessile hyperplastic polyps removed.  Internal hemorrhoids.  EGD August 16, 2019: Gastritis, biopsy negative for H. pylori infection, dysplasia or intestinal metaplasia     Outpatient Encounter Medications as of 02/08/2021  Medication Sig  . AMBULATORY NON FORMULARY MEDICATION Medication Name: Patient Drug Education for Nitroglycerin Ointment   A pea-sized drop should be placed on the tip of your finger and then gently placed inside the anus. The finger should be inserted 1/3 - 1/2 its length and may be covered with a plastic glove or finger cot. You may use Vaseline to help coat the finger or dilute the ointment.  The first few applications should be taken lying down, as mild light-headedness or a brief headache may occur.  The most common side effect - a headache. It is usually brief and mild, but may require Tylenol or Advil. You may dilute the NTG further with Vaseline to decrease the headaches.  Marland Kitchen amLODipine (NORVASC) 10 MG tablet Take 10 mg by mouth daily.  Marland Kitchen FLUoxetine (PROZAC WEEKLY) 90 MG DR capsule Take 90 mg by mouth every 7 (seven) days. On Mondays  . levothyroxine (SYNTHROID) 125 MCG tablet Take 1 tablet  (125 mcg total) by mouth daily before breakfast.  . nebivolol (BYSTOLIC) 10 MG tablet Take 10 mg by mouth daily.   No facility-administered encounter medications on file as of 02/08/2021.    Allergies as of 02/08/2021 - Review Complete 01/16/2021  Allergen Reaction Noted  . Lisinopril-hydrochlorothiazide  04/25/2020  . Penicillins Rash 07/06/2013    Past Medical History:  Diagnosis Date  . Anxiety   . H. pylori infection   . Hemorrhoids   . Hypertension   . Multiple thyroid nodules   . Thyroid disease    surgery planned 09/2019, with surgeon stated tracheal deviation     Past Surgical History:  Procedure Laterality Date  . COLONOSCOPY  08/16/2019  . THYROIDECTOMY N/A 09/16/2019   Procedure: TOTAL THYROIDECTOMY;  Surgeon: Armandina Gemma, MD;  Location: [facility]S;  Service: General;  Laterality: N/A;  . UPPER GASTROINTESTINAL ENDOSCOPY  08/16/2019    Family History  Problem Relation Age of Onset  . Hypertension Mother   . Hypertension Father   . Esophageal cancer Neg Hx   . Pancreatic cancer Neg Hx   . Stomach cancer Neg Hx   . Rectal cancer Neg Hx   . Liver disease Neg Hx   . Liver cancer Neg Hx   . Colon cancer Neg Hx   . Colon polyps Neg Hx     Social History   Socioeconomic History  . Marital status: Single    Spouse name: Not on file  . Number of children: Not on file  . Years of education: Not  on file  . Highest education level: Not on file  Occupational History  . Not on file  Tobacco Use  . Smoking status: Never Smoker  . Smokeless tobacco: Never Used  Vaping Use  . Vaping Use: Never used  Substance and Sexual Activity  . Alcohol use: No  . Drug use: Never  . Sexual activity: Not on file  Other Topics Concern  . Not on file  Social History Narrative  . Not on file   Social Determinants of Health   Financial Resource Strain: Not on file  Food Insecurity: Not on file  Transportation Needs: Not on file  Physical Activity: Not on file  Stress: Not  on file  Social Connections: Not on file  Intimate Partner Violence: Not on file      Review of systems: All other review of systems negative except as mentioned in the HPI.   Physical Exam: Vitals:   02/08/21 1526  BP: 124/72  Pulse: (!) 56   Body mass index is 28.81 kg/m. Gen:      No acute distress HEENT:  sclera anicteric Abd:      soft, non-tender; no palpable masses, no distension Ext:    No edema Neuro: alert and oriented x 3 Psych: normal mood and affect  Data Reviewed:  Reviewed labs, radiology imaging, old records and pertinent past GI work up   Assessment and Plan/Recommendations: 50 year old very pleasant gentleman with symptomatic hemorrhoids s/p hemorrhoidal band ligation Continue with high-fiber diet and increase water intake Avoid excessive straining during defecation Okay to use Anusol suppositories per rectum as needed  GERD: Start pantoprazole 40 mg daily before dinner, advised patient to take it at different time from thyroid medication to avoid drug interaction Antireflux measures  Call if continues to have persistent symptoms despite PPI use for 2 months, may consider EGD for further evaluation Return in 6 months or sooner if needed  The patient was provided an opportunity to ask questions and all were answered. The patient agreed with the plan and demonstrated an understanding of the instructions.  [name] , MD    CC: Tisovec, Fransico Him, MD

## 2021-02-08 NOTE — Patient Instructions (Addendum)
We will send pantoprazole and anusol suppositories to your pharmacy  Due to recent changes in healthcare laws, you may see the results of your imaging and laboratory studies on MyChart before your provider has had a chance to review them.  We understand that in some cases there may be results that are confusing or concerning to you. Not all laboratory results come back in the same time frame and the provider may be waiting for multiple results in order to interpret others.  Please give [place] 48 hours in order for your provider to thoroughly review all the results before contacting the office for clarification of your results.   I appreciate the  opportunity to care for you  Thank You   Harl Bowie , MD

## 2021-02-14 ENCOUNTER — Ambulatory Visit: Payer: 59 | Admitting: Psychiatry

## 2021-02-26 ENCOUNTER — Encounter: Payer: Self-pay | Admitting: Gastroenterology

## 2021-02-28 IMAGING — US ULTRASOUND FNA BIOPSY THYROID 1ST LESION
1 series · 13 of 18 positions shown · non-contrast
Comparison: US Thyroid 01/27/2019

MEDICATIONS:
5 cc 1% lidocaine

COMPLICATIONS:
None immediate.

INDICATION: Indeterminate thyroid nodule

Right mid thyroid nodule: 6.5 cm
EXAM:
ULTRASOUND GUIDED FINE NEEDLE ASPIRATION OF INDETERMINATE THYROID
NODULE
TECHNIQUE: Informed written consent was obtained from the patient after a
discussion of the risks, benefits and alternatives to treatment.
Questions regarding the procedure were encouraged and answered. A
timeout was performed prior to the initiation of the procedure.

[Series 1: ultrasound fna biopsy thyroid 1st lesion · 0.08mm/px · 18 acquisitions, 13 frames shown]
[im 1/18]
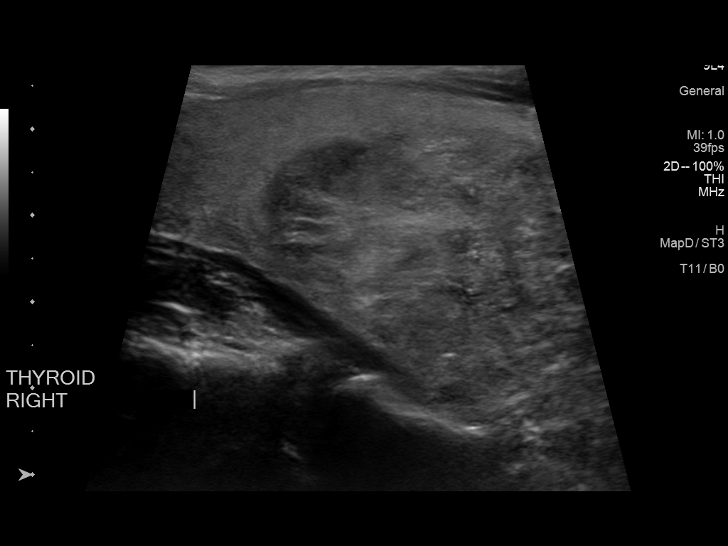
[im 3/18]
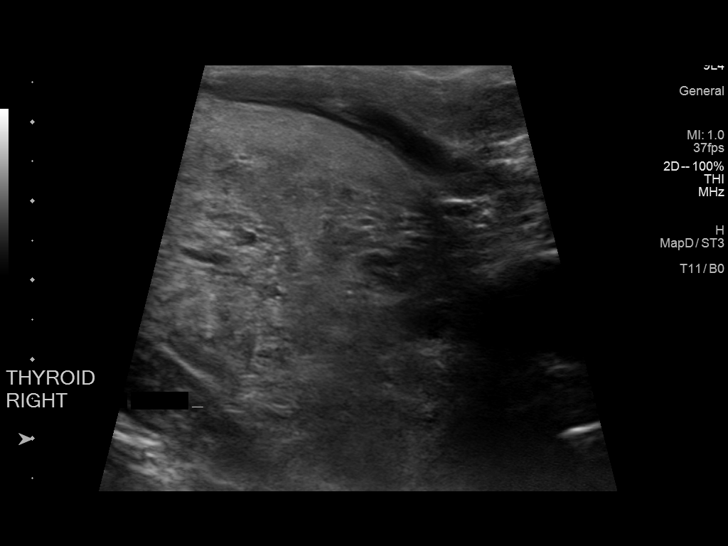
[im 4/18]
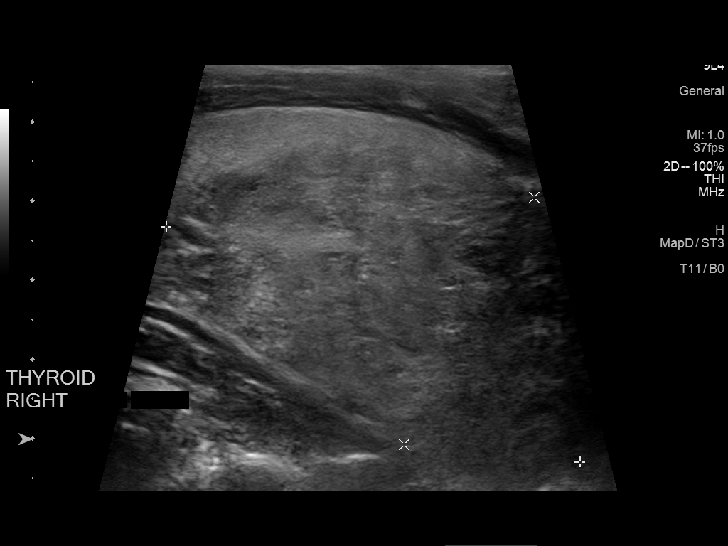
[im 5/18]
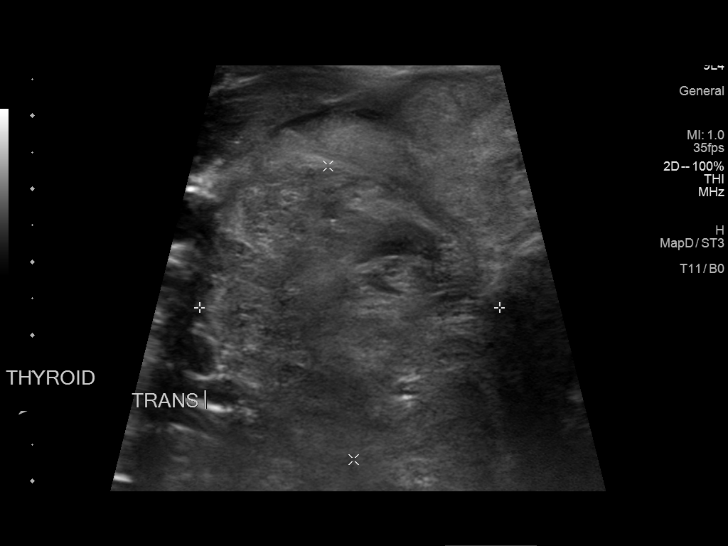
[im 7/18]
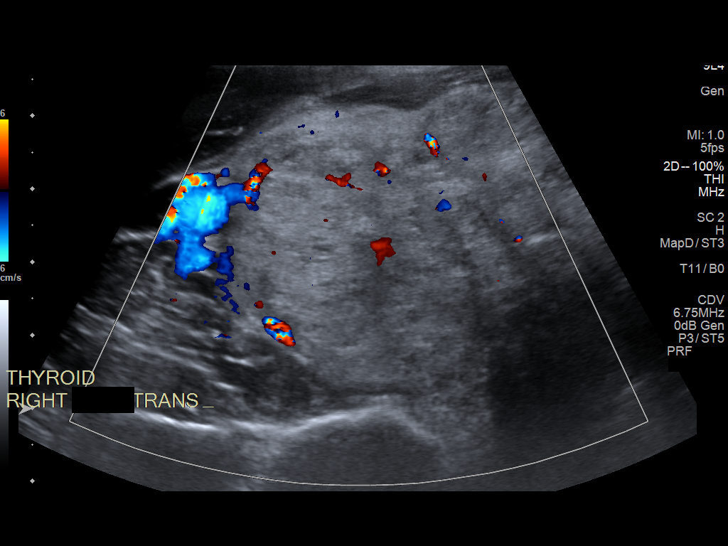
[im 8/18]
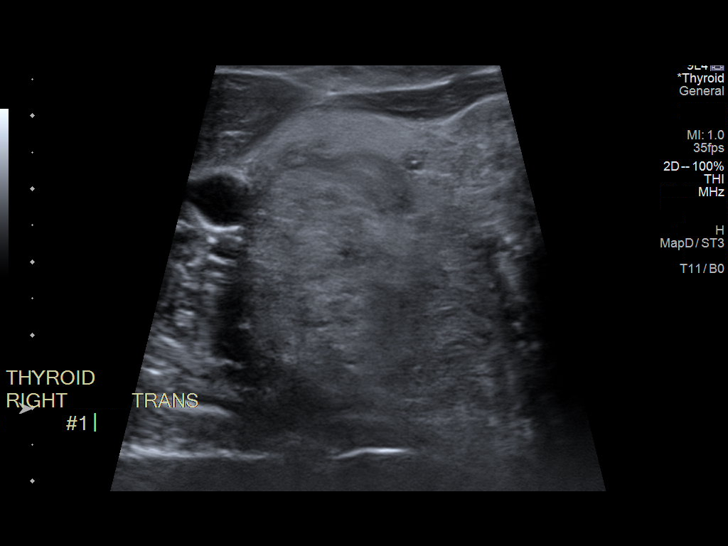
[im 10/18]
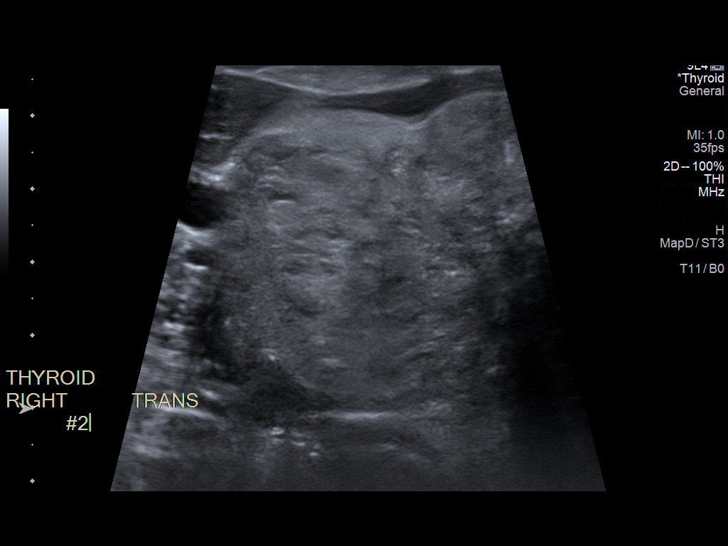
[im 11/18]
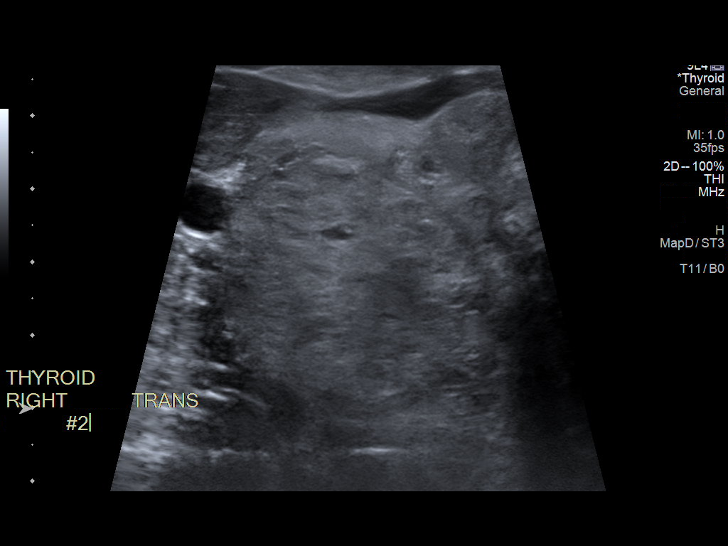
[im 12/18]
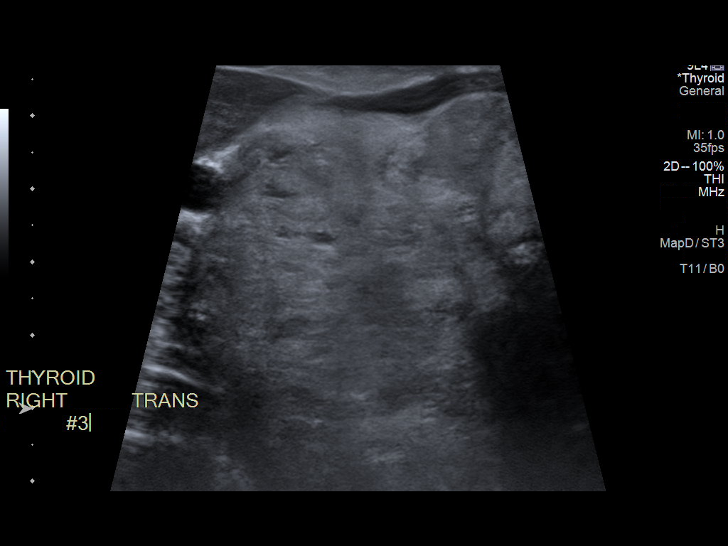
[im 14/18]
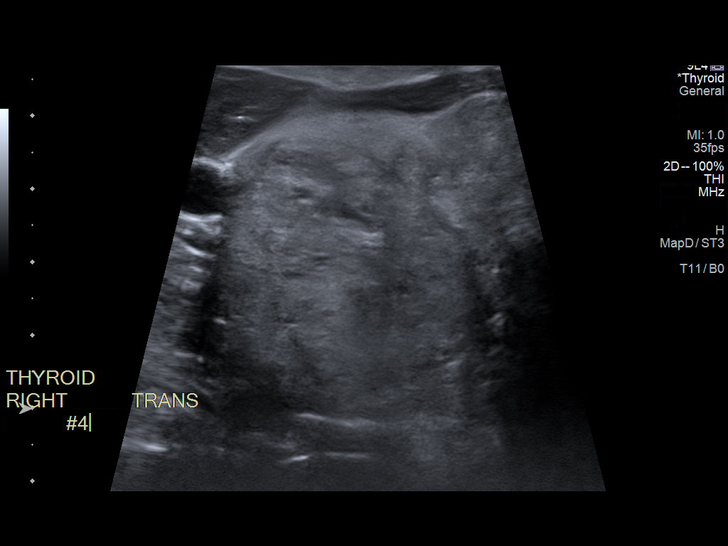
[im 15/18]
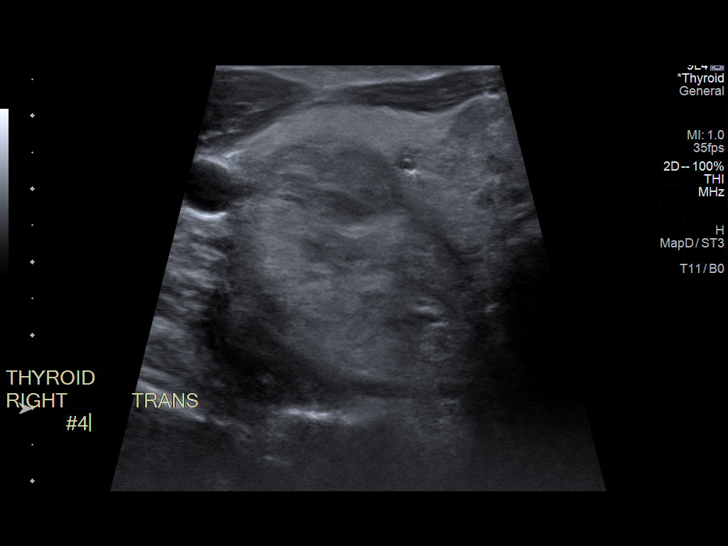
[im 16/18]
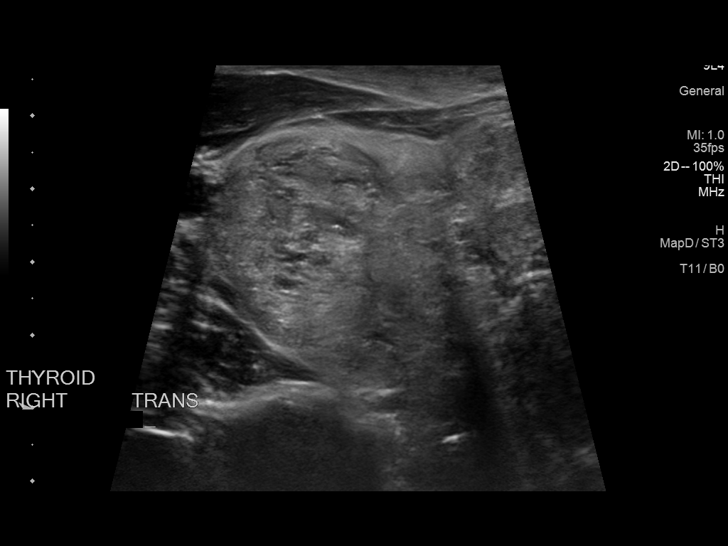
[im 18/18]
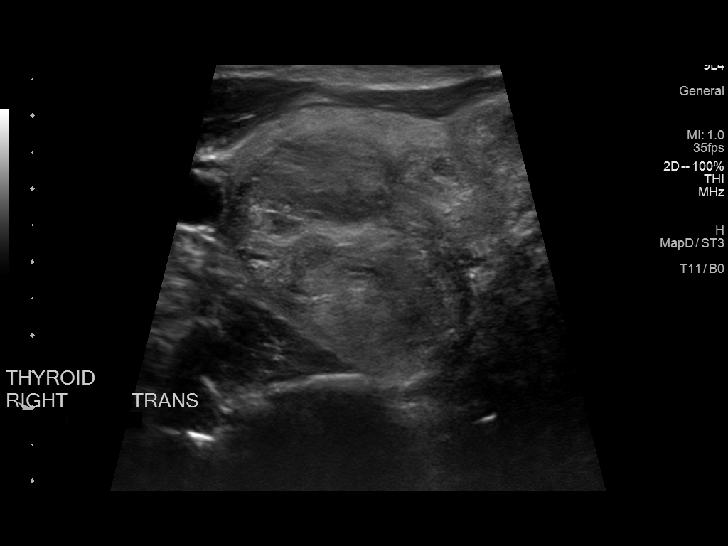

[13 of 18 positions shown; findings below may reference images not displayed]

Pre-procedural ultrasound scanning demonstrated unchanged size and
appearance of the indeterminate nodule within the right thyroid

The procedure was planned. The neck was prepped in the usual sterile
fashion, and a sterile drape was applied covering the operative
field. A timeout was performed prior to the initiation of the
procedure. Local anesthesia was provided with 1% lidocaine.

Under direct ultrasound guidance, 4 FNA biopsies were performed of
the right mid thyroid nodule with a 25 gauge needle. Multiple
ultrasound images were saved for procedural documentation purposes.
The samples were prepared and submitted to pathology.

Limited post procedural scanning was negative for hematoma or
additional complication. Dressings were placed. The patient
tolerated the above procedures procedure well without immediate
postprocedural complication.
FINDINGS: Nodule reference number based on prior diagnostic ultrasound: 1

Maximum size: 6.5 cm

Location: Right; Mid

ACR TI-RADS risk category: TR3 (3 points)

Reason for biopsy: meets ACR TI-RADS criteria

Ultrasound imaging confirms appropriate placement of the needles
within the thyroid nodule.
IMPRESSION: Technically successful ultrasound guided fine needle aspiration of
right mid thyroid nodule

Read by

Kanika Carmel

## 2021-03-20 NOTE — Progress Notes (Signed)
Cardiology Office Note:   Date:  03/21/2021  NAME:  Earl Hart    MRN: [id number] DOB:  Nov 16, 1970   PCP:  Haywood Pao, MD  Cardiologist:  None  Electrophysiologist:  None   Referring MD: Haywood Pao, MD   Chief Complaint  Patient presents with  . Abnormal ECG   History of Present Illness:   Earl Hart is a 50 y.o. male with a hx of HTN who is being seen today for the evaluation of abnormal EKG/syncope at the request of Tisovec, Fransico Him, MD.  He was evaluated by his primary care physician 2 days ago for syncope.  He had a passing out spell on Friday.  He reports that this occurred around 9 PM.  He reports he got up to go to the bathroom to wash his face and felt dizzy and lightheaded.  He also was sweaty.  He reports he blacked out for 15 to 20 seconds.  No chest pain or shortness of breath before the event.  No palpitations reported.  He reports that he had had a little to eat that day.  He had a chicken sandwich earlier around 1 PM.  He reports he had 2 episodes of diarrhea after that.  He reports he did not eat anything after 1 PM.  He did increase fluids.  He does take blood pressure medication including amlodipine and nebivolol.  He did take those medications that day.  He came to quickly.  He was back to his normal self and 15 to 20 minutes.  He did not go to the emergency room.  He was seen by his primary care physician.  He reports that all blood work was normal.  He did have a TSH in April that was 10.9.  He reports his thyroid medications were adjusted.  He was noted to have 2 PVCs on EKG at his primary care physician office.  He was then sent for further evaluation.  He is EKG in office demonstrates sinus rhythm with early repolarization abnormality.  He reports that he does have palpitations.  They occur for years.  He reports that he gets them 2-3 times per week.  They can last 1 minute.  Described as a fluttering sensation.  No identifiable trigger or alleviating  factor.  He reports they resolve on their own.  He reports he goes to the gym 5 to 6 days/week.  He can do 1 hour on the treadmill or stairmaster.  He denies any chest pain or trouble breathing.  He also reports that he can run up to 8 to 10 miles without any limitations.  He is passing a stress test every day.  He denies any chest pain or trouble breathing with his current level of activity.  His BP in office is 128/76.  I suspect he is on too much blood pressure medication.  He reports it was high in the past but is never changes medications.  He is a never smoker.  He does not drink alcohol or use drugs.  He is a very healthy man.  He is not married.  No children.  No alcohol use reported around this time.  He overall seems to be doing well.  He has had no further passing out spells.  He reports this was an isolated episode.  Past Medical History: Past Medical History:  Diagnosis Date  . Anxiety   . H. pylori infection   . Hemorrhoids   . Hypertension   .  Multiple thyroid nodules   . Thyroid disease    surgery planned 09/2019, with surgeon stated tracheal deviation     Past Surgical History: Past Surgical History:  Procedure Laterality Date  . COLONOSCOPY  08/16/2019  . THYROIDECTOMY N/A 09/16/2019   Procedure: TOTAL THYROIDECTOMY;  Surgeon: Armandina Gemma, MD;  Location: [facility];  Service: General;  Laterality: N/A;  . UPPER GASTROINTESTINAL ENDOSCOPY  08/16/2019    Current Medications: Current Meds  Medication Sig  . amLODipine (NORVASC) 5 MG tablet Take 1 tablet (5 mg total) by mouth daily.  Marland Kitchen FLUoxetine (PROZAC WEEKLY) 90 MG DR capsule Take 90 mg by mouth every 7 (seven) days. On Mondays  . nebivolol (BYSTOLIC) 10 MG tablet Take 10 mg by mouth daily.  . [DISCONTINUED] amLODipine (NORVASC) 10 MG tablet Take 10 mg by mouth daily.  . [DISCONTINUED] hydrocortisone (ANUSOL-HC) 25 MG suppository Place 1 suppository (25 mg total) rectally at bedtime.  . [DISCONTINUED] pantoprazole  (PROTONIX) 40 MG tablet Take 1 tablet (40 mg total) by mouth 2 (two) times daily before a meal.     Allergies:    Lisinopril-hydrochlorothiazide and Penicillins   Social History: Social History   Socioeconomic History  . Marital status: Single    Spouse name: Not on file  . Number of children: 0  . Years of education: Not on file  . Highest education level: Not on file  Occupational History  . Not on file  Tobacco Use  . Smoking status: Never Smoker  . Smokeless tobacco: Never Used  Vaping Use  . Vaping Use: Never used  Substance and Sexual Activity  . Alcohol use: No  . Drug use: Never  . Sexual activity: Not on file  Other Topics Concern  . Not on file  Social History Narrative  . Not on file   Social Determinants of Health   Financial Resource Strain: Not on file  Food Insecurity: Not on file  Transportation Needs: Not on file  Physical Activity: Not on file  Stress: Not on file  Social Connections: Not on file     Family History: The patient's family history includes Hypertension in his father and mother; Stroke in his father. There is no history of Esophageal cancer, Pancreatic cancer, Stomach cancer, Rectal cancer, Liver disease, Liver cancer, Colon cancer, or Colon polyps.  ROS:   All other ROS reviewed and negative. Pertinent positives noted in the HPI.     EKGs/Labs/Other Studies Reviewed:   The following studies were personally reviewed by me today:  EKG:  EKG is ordered today.  The ekg ordered today demonstrates sinus bradycardia heart rate 59, no acute ischemic changes or evidence of infarction, and was personally reviewed by me.   Recent Labs: No results found for requested labs within last 8760 hours.   Recent Lipid Panel No results found for: CHOL, TRIG, HDL, CHOLHDL, VLDL, LDLCALC, LDLDIRECT  Physical Exam:   VS:  BP 128/76   Pulse (!) 59   Ht 5' 11.5" (1.816 m)   Wt 199 lb 12.8 oz (90.6 kg)   SpO2 96%   BMI 27.48 kg/m    Wt Readings  from Last 3 Encounters:  03/21/21 199 lb 12.8 oz (90.6 kg)  02/08/21 206 lb 9.6 oz (93.7 kg)  01/16/21 208 lb 2 oz (94.4 kg)    General: Well nourished, well developed, in no acute distress Head: Atraumatic, normal size  Eyes: PEERLA, EOMI  Neck: Supple, no JVD Endocrine: No thryomegaly Cardiac: Normal S1, S2; RRR;  no murmurs, rubs, or gallops Lungs: Clear to auscultation bilaterally, no wheezing, rhonchi or rales  Abd: Soft, nontender, no hepatomegaly  Ext: No edema, pulses 2+ Musculoskeletal: No deformities, BUE and BLE strength normal and equal Skin: Warm and dry, no rashes   Neuro: Alert and oriented to person, place, time, and situation, CNII-XII grossly intact, no focal deficits  Psych: Normal mood and affect   ASSESSMENT:   Earl Hart is a 50 y.o. male who presents for the following: 1. Vasovagal syncope   2. Palpitations   3. Dizziness   4. PVC (premature ventricular contraction)     PLAN:   1. Vasovagal syncope -His story is classic for vasovagal syncope.  EKG is normal.  No prodrome that is concerning.  No chest pain or shortness of breath.  He came to quickly.  He had 2 episodes of diarrhea that day and did not have much to eat or drink.  Symptoms resolved quickly.  No seizure-like activity reported.  His cardiovascular examination is normal. -No further cardiac work-up is needed.  He exercises daily and maintains a high level activity.  I have no concerns for obstructive CAD. -I think he is on too much blood pressure medication.  We will reduce his amlodipine to 5 mg daily.  He also describes lower extremity edema with this. -He will continue nebivolol 10 mg daily. -I do want him to keep a blood pressure log for the next few months.  He will check it daily.  We will then see him back in 3 months and reassess. -His EKG demonstrated PVCs at his primary care physician office.  I suspect these are benign.  We will work this up further with the monitor below.  These are  likely incidentally found not related to his passing out spell.  2. Palpitations -He describes palpitations which occur 2-3 times a week for years.  He reports they last 1 minute.  No identifiable trigger or alleviating factor.  His TSH in April was 10.9.  He reports this has been adjusted and was most recently normal.  He reports that his blood panel was normal as well. -We will proceed with a 7-day Zio patch just to quantify PVCs and to exclude arrhythmia.  Again this is unrelated to his syncope.  3. Dizziness -He has dizziness and palpitations.  This occurs daily.  Suspect this is from overmedication on BP meds.  Reduce amlodipine to 5 mg daily.  Continue nebivolol.  He will keep a blood pressure log.  We may need to continue to reduce the dose of medications.  He is a high functioning athlete.  I suspect he is just on too much blood pressure medicines.  4. PVC (premature ventricular contraction) -Appear to be isolated.  Seen on EKG at primary care physician office.  Labs reportedly all normal.  7-day ZIO as above.  Disposition: Return in about 3 months (around 06/21/2021).  Medication Adjustments/Labs and Tests Ordered: Current medicines are reviewed at length with the patient today.  Concerns regarding medicines are outlined above.  Orders Placed This Encounter  Procedures  . LONG TERM MONITOR (3-14 DAYS)  . EKG 12-Lead   Meds ordered this encounter  Medications  . amLODipine (NORVASC) 5 MG tablet    Sig: Take 1 tablet (5 mg total) by mouth daily.    Dispense:  90 tablet    Refill:  1    Patient Instructions  Medication Instructions:  Decrease Amlodipine 5 mg daily   *If you  need a refill on your cardiac medications before your next appointment, please call your pharmacy*   Testing/Procedures: [place] Monitor Instructions  Your physician has requested you wear a ZIO patch monitor for 7 days.  This is a single patch monitor. [employer] supplies one patch monitor per  enrollment. Additional stickers are not available. Please do not apply patch if you will be having a Nuclear Stress Test,  Echocardiogram, Cardiac CT, MRI, or Chest Xray during the period you would be wearing the  monitor. The patch cannot be worn during these tests. You cannot remove and re-apply the  ZIO XT patch monitor.  Your ZIO patch monitor will be mailed 3 day USPS to your address on file. It may take 3-5 days  to receive your monitor after you have been enrolled.  Once you have received your monitor, please review the enclosed instructions. Your monitor  has already been registered assigning a specific monitor serial # to you.  Billing and Patient Assistance Program Information  We have supplied [employer] with any of your insurance information on file for billing purposes. [employer] offers a sliding scale Patient Assistance Program for patients that do not have  insurance, or whose insurance does not completely cover the cost of the ZIO monitor.  You must apply for the Patient Assistance Program to qualify for this discounted rate.  To apply, please call [employer] at [phone], select option 4, select option 2, ask to apply for  Patient Assistance Program. Theodore Demark will ask your household income, and how many people  are in your household. They will quote your out-of-pocket cost based on that information.  [employer] will also be able to set up a 29-month, interest-free payment plan if needed.  Applying the monitor   Shave hair from upper left chest.  Hold abrader disc by orange tab. Rub abrader in 40 strokes over the upper left chest as  indicated in your monitor instructions.  Clean area with 4 enclosed alcohol pads. Let dry.  Apply patch as indicated in monitor instructions. Patch will be placed under collarbone on left  side of chest with arrow pointing upward.  Rub patch adhesive wings for 2 minutes. Remove white label marked "1". Remove the white  label marked "2". Rub patch  adhesive wings for 2 additional minutes.  While looking in a mirror, press and release button in center of patch. A small green light will  flash 3-4 times. This will be your only indicator that the monitor has been turned on.  Do not shower for the first 24 hours. You may shower after the first 24 hours.  Press the button if you feel a symptom. You will hear a small click. Record Date, Time and  Symptom in the Patient Logbook.  When you are ready to remove the patch, follow instructions on the last 2 pages of Patient  Logbook. Stick patch monitor onto the last page of Patient Logbook.  Place Patient Logbook in the blue and white box. Use locking tab on box and tape box closed  securely. The blue and white box has prepaid postage on it. Please place it in the mailbox as  soon as possible. Your physician should have your test results approximately 7 days after the  monitor has been mailed back to [facility].  Call [place] at [phone] if you have questions regarding  your ZIO XT patch monitor. Call them immediately if you see an orange light blinking on your  monitor.  If your monitor falls off in less than 4 days, contact our Monitor department at [phone].  If your monitor becomes loose or falls off after 4 days call [employer] at [phone] for  suggestions on securing your monitor   Follow-Up: At [facility], you and your health needs are our priority.  As part of our continuing mission to provide you with exceptional heart care, we have created designated Provider Care Teams.  These Care Teams include your primary Cardiologist (physician) and Advanced Practice Providers (APPs -  Physician Assistants and Nurse Practitioners) who all work together to provide you with the care you need, when you need it.  We recommend signing up for the patient portal called "MyChart".  Sign up information is provided on this After Visit Summary.  MyChart is used to  connect with patients for Virtual Visits (Telemedicine).  Patients are able to view lab/test results, encounter notes, upcoming appointments, etc.  Non-urgent messages can be sent to your provider as well.   To learn more about what you can do with MyChart, go to [email or web address].    Your next appointment:   3 month(s)  The format for your next appointment:   In Person  Provider:   Eleonore Chiquito, MD        Signed, Addison Naegeli. Audie Box, MD, [place]  [address], [place], [place] [address] [phone]  03/21/2021 9:34 AM

## 2021-03-21 ENCOUNTER — Encounter: Payer: Self-pay | Admitting: Cardiovascular Disease

## 2021-03-21 ENCOUNTER — Other Ambulatory Visit: Payer: Self-pay

## 2021-03-21 ENCOUNTER — Ambulatory Visit (INDEPENDENT_AMBULATORY_CARE_PROVIDER_SITE_OTHER): Payer: 59 | Admitting: Cardiovascular Disease

## 2021-03-21 ENCOUNTER — Ambulatory Visit (INDEPENDENT_AMBULATORY_CARE_PROVIDER_SITE_OTHER): Payer: 59

## 2021-03-21 VITALS — BP 128/76 | HR 59 | Ht 71.5 in | Wt 199.8 lb

## 2021-03-21 DIAGNOSIS — R42 Dizziness and giddiness: Secondary | ICD-10-CM

## 2021-03-21 DIAGNOSIS — R9431 Abnormal electrocardiogram [ECG] [EKG]: Secondary | ICD-10-CM

## 2021-03-21 DIAGNOSIS — R55 Syncope and collapse: Secondary | ICD-10-CM | POA: Diagnosis not present

## 2021-03-21 DIAGNOSIS — I493 Ventricular premature depolarization: Secondary | ICD-10-CM

## 2021-03-21 DIAGNOSIS — R002 Palpitations: Secondary | ICD-10-CM | POA: Diagnosis not present

## 2021-03-21 MED ORDER — AMLODIPINE BESYLATE 5 MG PO TABS: 5.0000 mg | ORAL_TABLET | Freq: Every day | ORAL | 1 refills | Status: DC

## 2021-03-21 NOTE — Patient Instructions (Signed)
Medication Instructions:  Decrease Amlodipine 5 mg daily   *If you need a refill on your cardiac medications before your next appointment, please call your pharmacy*   Testing/Procedures: [place] Monitor Instructions  Your physician has requested you wear a ZIO patch monitor for 7 days.  This is a single patch monitor. [employer] supplies one patch monitor per enrollment. Additional stickers are not available. Please do not apply patch if you will be having a Nuclear Stress Test,  Echocardiogram, Cardiac CT, MRI, or Chest Xray during the period you would be wearing the  monitor. The patch cannot be worn during these tests. You cannot remove and re-apply the  ZIO XT patch monitor.  Your ZIO patch monitor will be mailed 3 day USPS to your address on file. It may take 3-5 days  to receive your monitor after you have been enrolled.  Once you have received your monitor, please review the enclosed instructions. Your monitor  has already been registered assigning a specific monitor serial # to you.  Billing and Patient Assistance Program Information  We have supplied [employer] with any of your insurance information on file for billing purposes. [employer] offers a sliding scale Patient Assistance Program for patients that do not have  insurance, or whose insurance does not completely cover the cost of the ZIO monitor.  You must apply for the Patient Assistance Program to qualify for this discounted rate.  To apply, please call [employer] at [phone], select option 4, select option 2, ask to apply for  Patient Assistance Program. Theodore Demark will ask your household income, and how many people  are in your household. They will quote your out-of-pocket cost based on that information.  [employer] will also be able to set up a 65-month, interest-free payment plan if needed.  Applying the monitor   Shave hair from upper left chest.  Hold abrader disc by orange tab. Rub abrader in 40 strokes over the  upper left chest as  indicated in your monitor instructions.  Clean area with 4 enclosed alcohol pads. Let dry.  Apply patch as indicated in monitor instructions. Patch will be placed under collarbone on left  side of chest with arrow pointing upward.  Rub patch adhesive wings for 2 minutes. Remove white label marked "1". Remove the white  label marked "2". Rub patch adhesive wings for 2 additional minutes.  While looking in a mirror, press and release button in center of patch. A small green light will  flash 3-4 times. This will be your only indicator that the monitor has been turned on.  Do not shower for the first 24 hours. You may shower after the first 24 hours.  Press the button if you feel a symptom. You will hear a small click. Record Date, Time and  Symptom in the Patient Logbook.  When you are ready to remove the patch, follow instructions on the last 2 pages of Patient  Logbook. Stick patch monitor onto the last page of Patient Logbook.  Place Patient Logbook in the blue and white box. Use locking tab on box and tape box closed  securely. The blue and white box has prepaid postage on it. Please place it in the mailbox as  soon as possible. Your physician should have your test results approximately 7 days after the  monitor has been mailed back to [facility].  Call [place] at [phone] if you have questions regarding  your ZIO XT patch monitor. Call them immediately if  you see an orange light blinking on your  monitor.  If your monitor falls off in less than 4 days, contact our Monitor department at [phone].  If your monitor becomes loose or falls off after 4 days call [employer] at [phone] for  suggestions on securing your monitor   Follow-Up: At [facility], you and your health needs are our priority.  As part of our continuing mission to provide you with exceptional heart care, we have created designated Provider Care Teams.  These  Care Teams include your primary Cardiologist (physician) and Advanced Practice Providers (APPs -  Physician Assistants and Nurse Practitioners) who all work together to provide you with the care you need, when you need it.  We recommend signing up for the patient portal called "MyChart".  Sign up information is provided on this After Visit Summary.  MyChart is used to connect with patients for Virtual Visits (Telemedicine).  Patients are able to view lab/test results, encounter notes, upcoming appointments, etc.  Non-urgent messages can be sent to your provider as well.   To learn more about what you can do with MyChart, go to [email or web address].    Your next appointment:   3 month(s)  The format for your next appointment:   In Person  Provider:   Eleonore Chiquito, MD

## 2021-03-21 NOTE — Progress Notes (Unsigned)
Enrolled patient for a 7 day Zio XT monitor to be mailed to patient home

## 2021-03-22 ENCOUNTER — Ambulatory Visit: Payer: 59 | Admitting: Psychiatry

## 2021-03-25 DIAGNOSIS — R002 Palpitations: Secondary | ICD-10-CM | POA: Diagnosis not present

## 2021-05-04 ENCOUNTER — Ambulatory Visit: Payer: 59 | Admitting: Gastroenterology

## 2021-05-04 ENCOUNTER — Encounter: Payer: Self-pay | Admitting: Gastroenterology

## 2021-05-04 VITALS — BP 110/78 | HR 56 | Ht 71.0 in | Wt 203.0 lb

## 2021-05-04 DIAGNOSIS — K641 Second degree hemorrhoids: Secondary | ICD-10-CM

## 2021-05-04 DIAGNOSIS — K6289 Other specified diseases of anus and rectum: Secondary | ICD-10-CM | POA: Diagnosis not present

## 2021-05-04 DIAGNOSIS — K219 Gastro-esophageal reflux disease without esophagitis: Secondary | ICD-10-CM

## 2021-05-04 NOTE — Progress Notes (Signed)
Earl Hart    UT:[id number]    1971-06-23  Primary Care Physician:Tisovec, Fransico Him, MD  Referring Physician: Haywood Pao, MD [address] [place],  [place] [address]   Chief complaint: Hemorrhoids, rectal pain, GERD  HPI:  50 yr old very pleasant gentleman  s/p hemorrhoidal band ligation here for follow-up of rectal pain.   He had rectal pain and discomfort following last hemorrhoidal banding in April which has since resolved.  He had an episode 2 weeks ago, that improved spontaneously.  Denies any rectal bleeding  He is no longer using nitroglycerin.   Is experiencing intermittent epigastric discomfort and acid reflux symptoms, currently not on any acid suppressive medication because of interaction with thyroid medication Denies any dysphagia, weight loss   Colonoscopy August 16, 2019: 2 small sessile hyperplastic polyps removed.  Internal hemorrhoids.   EGD August 16, 2019: Gastritis, biopsy negative for H. pylori infection, dysplasia or intestinal metaplasia   Outpatient Encounter Medications as of 05/04/2021  Medication Sig   amLODipine (NORVASC) 5 MG tablet Take 1 tablet (5 mg total) by mouth daily.   FLUoxetine (PROZAC WEEKLY) 90 MG DR capsule Take 90 mg by mouth every 7 (seven) days. On Mondays   levothyroxine (SYNTHROID) 125 MCG tablet Take 1 tablet (125 mcg total) by mouth daily before breakfast.   nebivolol (BYSTOLIC) 10 MG tablet Take 10 mg by mouth daily.   No facility-administered encounter medications on file as of 05/04/2021.    Allergies as of 05/04/2021 - Review Complete 03/21/2021  Allergen Reaction Noted   Lisinopril-hydrochlorothiazide  04/25/2020   Penicillins Rash 07/06/2013    Past Medical History:  Diagnosis Date   Anxiety    H. pylori infection    Hemorrhoids    Hypertension    Multiple thyroid nodules    Thyroid disease    surgery planned 09/2019, with surgeon stated tracheal deviation     Past Surgical History:   Procedure Laterality Date   COLONOSCOPY  08/16/2019   THYROIDECTOMY N/A 09/16/2019   Procedure: TOTAL THYROIDECTOMY;  Surgeon: Armandina Gemma, MD;  Location: [facility];  Service: General;  Laterality: N/A;   UPPER GASTROINTESTINAL ENDOSCOPY  08/16/2019    Family History  Problem Relation Age of Onset   Hypertension Mother    Hypertension Father    Stroke Father    Esophageal cancer Neg Hx    Pancreatic cancer Neg Hx    Stomach cancer Neg Hx    Rectal cancer Neg Hx    Liver disease Neg Hx    Liver cancer Neg Hx    Colon cancer Neg Hx    Colon polyps Neg Hx     Social History   Socioeconomic History   Marital status: Single    Spouse name: Not on file   Number of children: 0   Years of education: Not on file   Highest education level: Not on file  Occupational History   Not on file  Tobacco Use   Smoking status: Never   Smokeless tobacco: Never  Vaping Use   Vaping Use: Never used  Substance and Sexual Activity   Alcohol use: No   Drug use: Never   Sexual activity: Not on file  Other Topics Concern   Not on file  Social History Narrative   Not on file   Social Determinants of Health   Financial Resource Strain: Not on file  Food Insecurity: Not on file  Transportation  Needs: Not on file  Physical Activity: Not on file  Stress: Not on file  Social Connections: Not on file  Intimate Partner Violence: Not on file      Review of systems: All other review of systems negative except as mentioned in the HPI.   Physical Exam: Vitals:   05/04/21 1342  BP: 110/78  Pulse: (!) 56   Body mass index is 28.31 kg/m. Gen:      No acute distress Neuro: alert and oriented x 3 Psych: normal mood and affect  Data Reviewed:  Reviewed labs, radiology imaging, old records and pertinent past GI work up   Assessment and Plan/Recommendations:  50 year old very pleasant gentleman with symptomatic hemorrhoids s/p hemorrhoidal band ligation Continue with high-fiber diet  and increase water intake Avoid excessive straining during defecation Use Carmol or RectiCare as needed for intermittent rectal discomfort   GERD: Continue antireflux measures Use Pepcid 20 mg twice daily as needed for breakthrough heartburn   Return as needed   The patient was provided an opportunity to ask questions and all were answered. The patient agreed with the plan and demonstrated an understanding of the instructions.  Earl Hart , MD    CC: Tisovec, Fransico Him, MD

## 2021-05-04 NOTE — Patient Instructions (Signed)
Use Calmol 4 or Recticare as needed  Take Pepcid 20 mg as needed  [employer]'s Current Therapy 2021 (pp. 213-216). [place], [place]: Elsevier.">  Gastroesophageal Reflux Disease, Adult Gastroesophageal reflux (GER) happens when acid from the stomach flows up into the tube that connects the mouth and the stomach (esophagus). Normally, food travels down the esophagus and stays in the stomach to be digested. However, when a person has GER, food and stomach acid sometimes move back up into the esophagus. If this becomes a more serious problem, the person may be diagnosed with a disease called gastroesophageal reflux disease (GERD). GERD occurs when the reflux: Happens often. Causes frequent or severe symptoms. Causes problems such as damage to the esophagus. When stomach acid comes in contact with the esophagus, the acid may cause inflammation in the esophagus. Over time, GERD may create small holes (ulcers) in the lining of the esophagus. What are the causes? This condition is caused by a problem with the muscle between the esophagus and the stomach (lower esophageal sphincter, or LES). Normally, the LES muscle closes after food passes through the esophagus to the stomach. When the LES is weakened or abnormal, it does not close properly, and that allows food and stomach acid to go back up into theesophagus. The LES can be weakened by certain dietary substances, medicines, and medical conditions, including: Tobacco use. Pregnancy. Having a hiatal hernia. Alcohol use. Certain foods and beverages, such as coffee, chocolate, onions, and peppermint. What increases the risk? You are more likely to develop this condition if you: Have an increased body weight. Have a connective tissue disorder. Take NSAIDs, such as ibuprofen. What are the signs or symptoms? Symptoms of this condition include: Heartburn. Difficult or painful swallowing and the feeling of having a lump in the throat. A bitter taste in the  mouth. Bad breath and having a large amount of saliva. Having an upset or bloated stomach and belching. Chest pain. Different conditions can cause chest pain. Make sure you see your health care provider if you experience chest pain. Shortness of breath or wheezing. Ongoing (chronic) cough or a nighttime cough. Wearing away of tooth enamel. Weight loss. How is this diagnosed? This condition may be diagnosed based on a medical history and a physical exam. To determine if you have mild or severe GERD, your health care provider may also monitor how you respond to treatment. You may also have tests, including: A test to examine your stomach and esophagus with a small camera (endoscopy). A test that measures the acidity level in your esophagus. A test that measures how much pressure is on your esophagus. A barium swallow or modified barium swallow test to show the shape, size, and functioning of your esophagus. How is this treated? Treatment for this condition may vary depending on how severe your symptoms are. Your health care provider may recommend: Changes to your diet. Medicine. Surgery. The goal of treatment is to help relieve your symptoms and to preventcomplications. Follow these instructions at home: Eating and drinking  Follow a diet as recommended by your health care provider. This may involve avoiding foods and drinks such as: Coffee and tea, with or without caffeine. Drinks that contain alcohol. Energy drinks and sports drinks. Carbonated drinks or sodas. Chocolate and cocoa. Peppermint and mint flavorings. Garlic and onions. Horseradish. Spicy and acidic foods, including peppers, chili powder, curry powder, vinegar, hot sauces, and barbecue sauce. Citrus fruit juices and citrus fruits, such as oranges, lemons, and limes. Tomato-based foods, such as  red sauce, chili, salsa, and pizza with red sauce. Fried and fatty foods, such as donuts, french fries, potato chips, and  high-fat dressings. High-fat meats, such as hot dogs and fatty cuts of red and white meats, such as rib eye steak, sausage, ham, and bacon. High-fat dairy items, such as whole milk, butter, and cream cheese. Eat small, frequent meals instead of large meals. Avoid drinking large amounts of liquid with your meals. Avoid eating meals during the 2-3 hours before bedtime. Avoid lying down right after you eat. Do not exercise right after you eat.  Lifestyle  Do not use any products that contain nicotine or tobacco. These products include cigarettes, chewing tobacco, and vaping devices, such as e-cigarettes. If you need help quitting, ask your health care provider. Try to reduce your stress by using methods such as yoga or meditation. If you need help reducing stress, ask your health care provider. If you are overweight, reduce your weight to an amount that is healthy for you. Ask your health care provider for guidance about a safe weight loss goal.  General instructions Pay attention to any changes in your symptoms. Take over-the-counter and prescription medicines only as told by your health care provider. Do not take aspirin, ibuprofen, or other NSAIDs unless your health care provider told you to take these medicines. Wear loose-fitting clothing. Do not wear anything tight around your waist that causes pressure on your abdomen. Raise (elevate) the head of your bed about 6 inches (15 cm). You can use a wedge to do this. Avoid bending over if this makes your symptoms worse. Keep all follow-up visits. This is important. Contact a health care provider if: You have: New symptoms. Unexplained weight loss. Difficulty swallowing or it hurts to swallow. Wheezing or a persistent cough. A hoarse voice. Your symptoms do not improve with treatment. Get help right away if: You have sudden pain in your arms, neck, jaw, teeth, or back. You suddenly feel sweaty, dizzy, or light-headed. You have chest pain  or shortness of breath. You vomit and the vomit is green, yellow, or black, or it looks like blood or coffee grounds. You faint. You have stool that is red, bloody, or black. You cannot swallow, drink, or eat. These symptoms may represent a serious problem that is an emergency. Do not wait to see if the symptoms will go away. Get medical help right away. Call your local emergency services (911 in the [place] Do not drive yourself to the hospital. Summary Gastroesophageal reflux happens when acid from the stomach flows up into the esophagus. GERD is a disease in which the reflux happens often, causes frequent or severe symptoms, or causes problems such as damage to the esophagus. Treatment for this condition may vary depending on how severe your symptoms are. Your health care provider may recommend diet and lifestyle changes, medicine, or surgery. Contact a health care provider if you have new or worsening symptoms. Take over-the-counter and prescription medicines only as told by your health care provider. Do not take aspirin, ibuprofen, or other NSAIDs unless your health care provider told you to do so. Keep all follow-up visits as told by your health care provider. This is important. This information is not intended to replace advice given to you by your health care provider. Make sure you discuss any questions you have with your healthcare provider. Document Revised: 04/10/2020 Document Reviewed: 04/10/2020 Elsevier Patient Education  2022 [employer].  I appreciate the  opportunity to care for you  Thank You   Harl Bowie , MD

## 2021-05-11 ENCOUNTER — Encounter: Payer: Self-pay | Admitting: Gastroenterology

## 2021-06-25 IMAGING — US US ABDOMEN LIMITED
1 series · 14 of 25 positions shown · non-contrast
Comparison: None

CLINICAL DATA: RIGHT upper quadrant pain after meals off and on for
6 months, acute worsening for 3 days

EXAM:
ULTRASOUND ABDOMEN LIMITED RIGHT UPPER QUADRANT

[Series 1: us abdomen limited · 0.17mm/px · 14 of 45 slices shown]
[im 1/45]
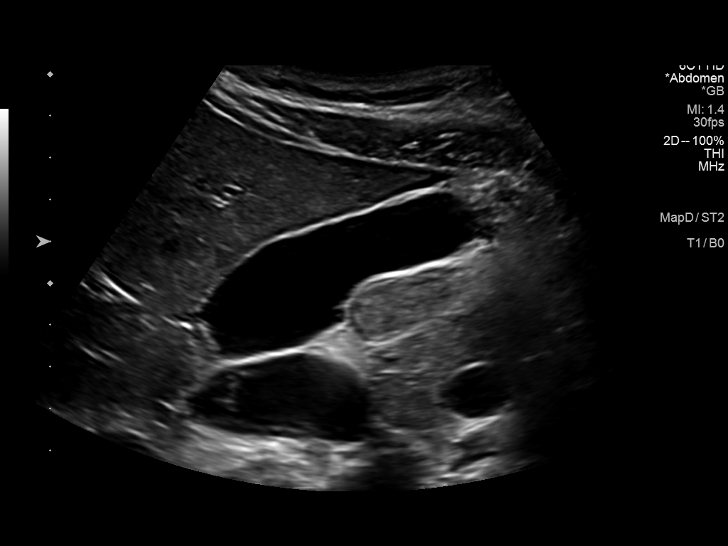
[im 4/45]
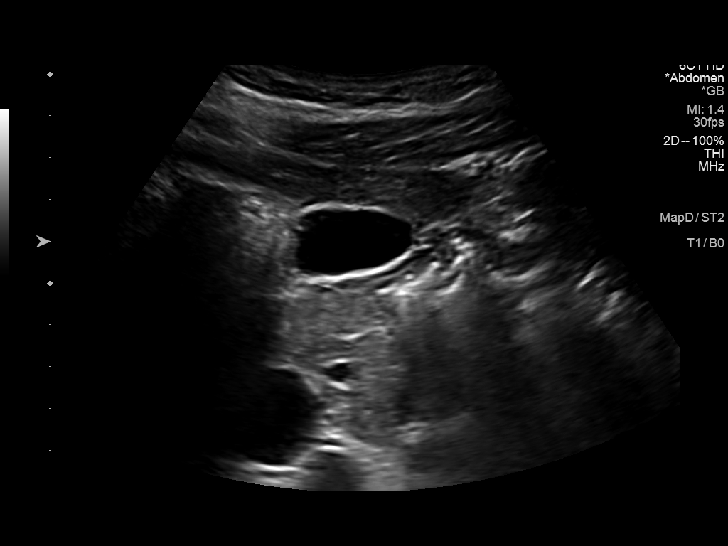
[im 8/45]
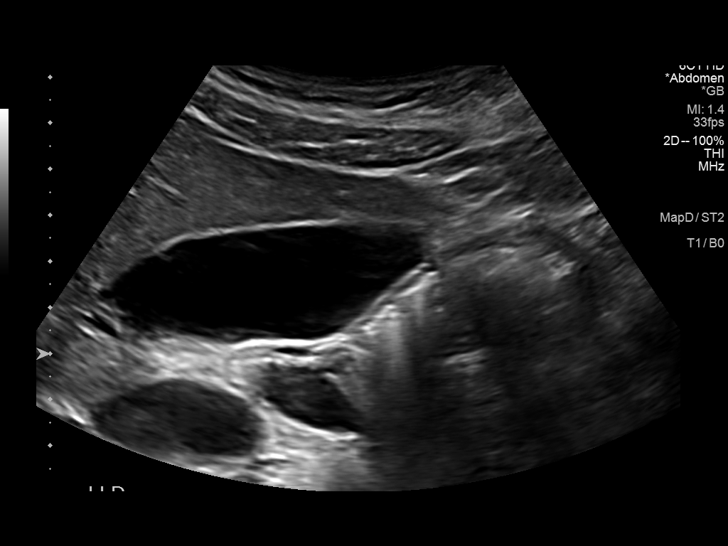
[im 12/45]
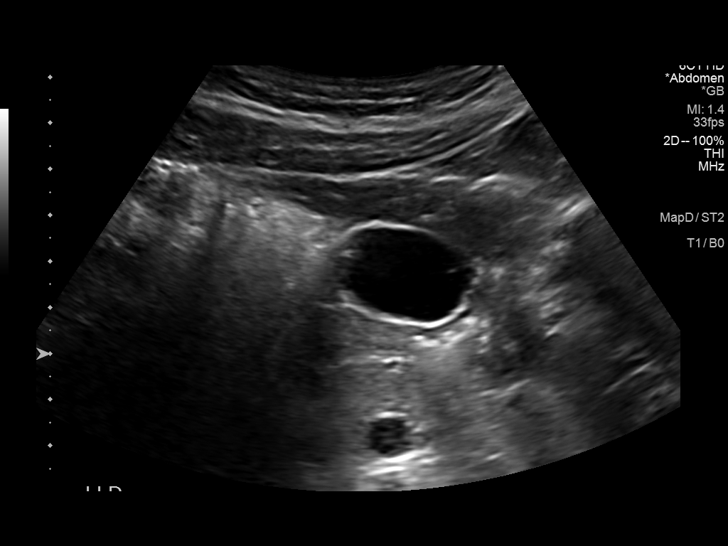
[im 15/45]
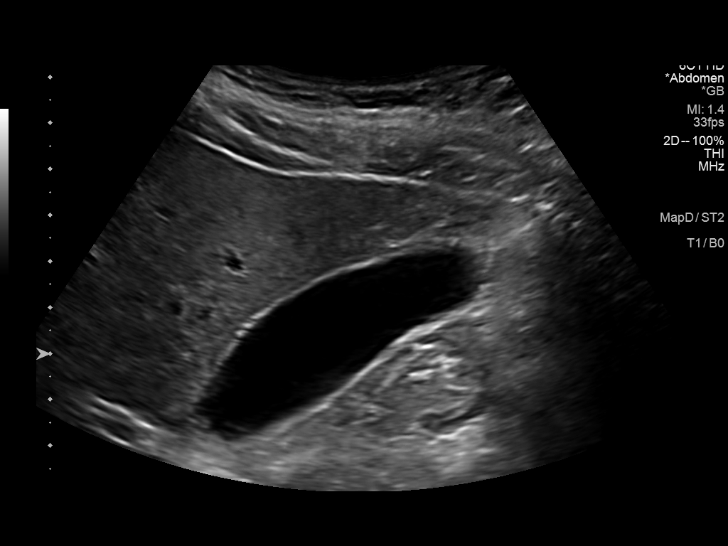
[im 17/45]
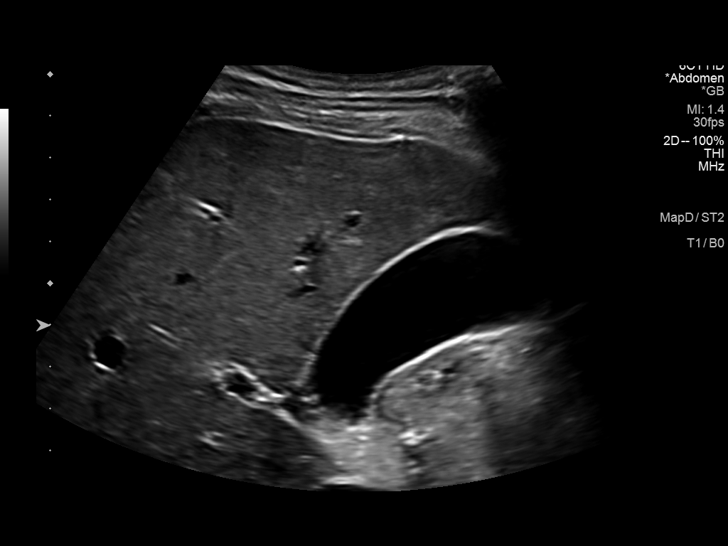
[im 21/45]
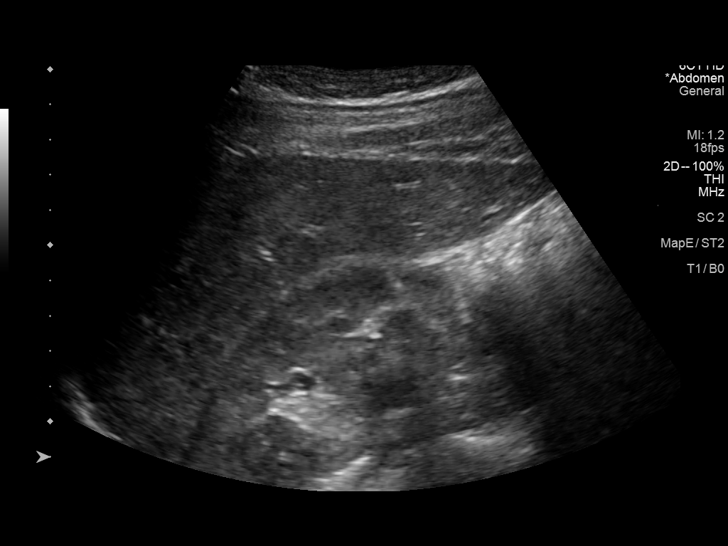
[im 24/45]
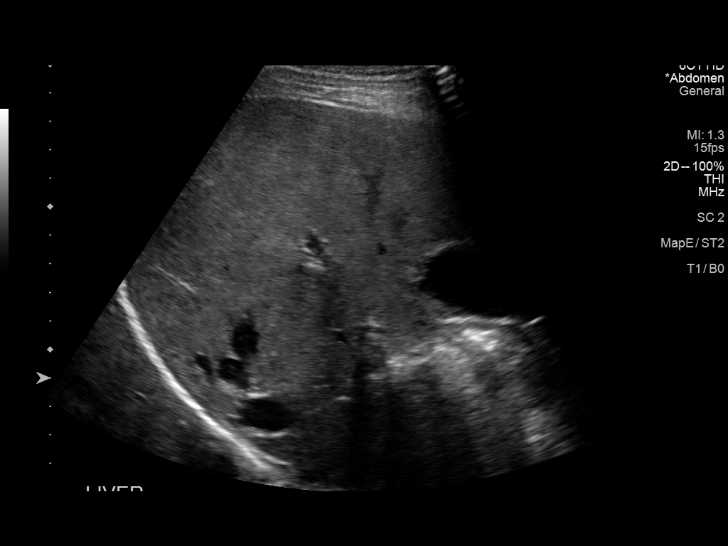
[im 28/45]
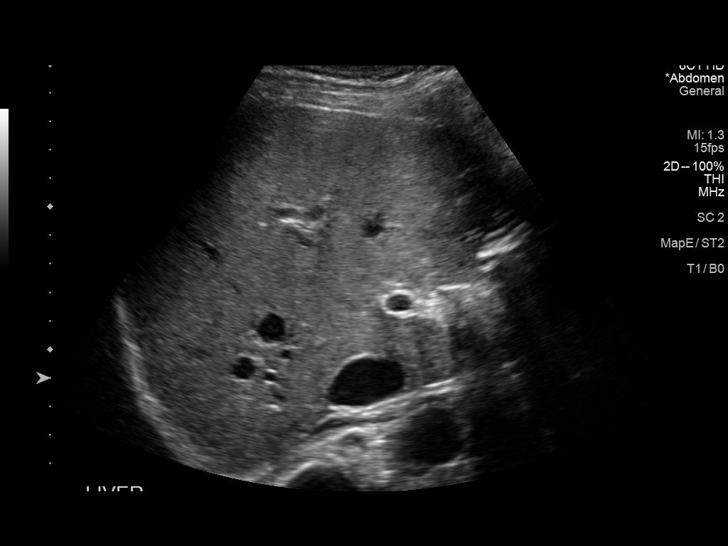
[im 30/45]
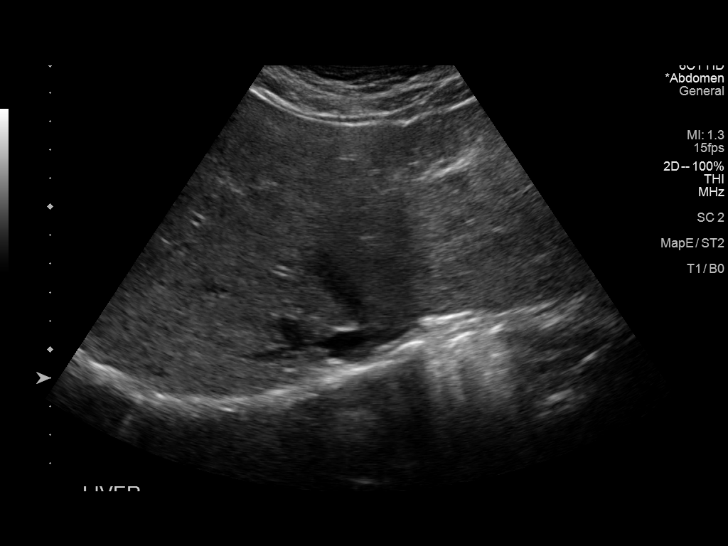
[im 34/45]
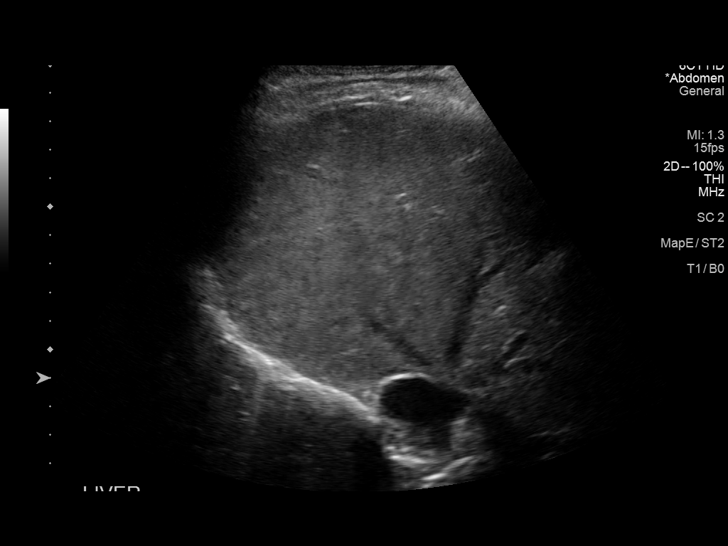
[im 37/45]
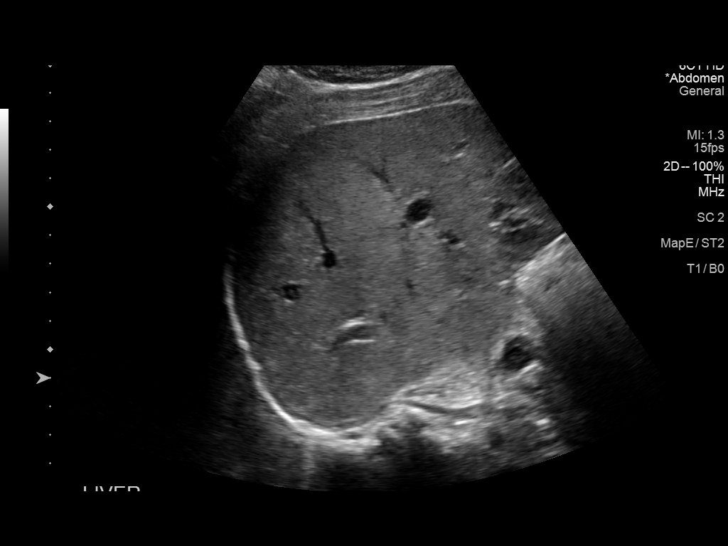
[im 41/45]
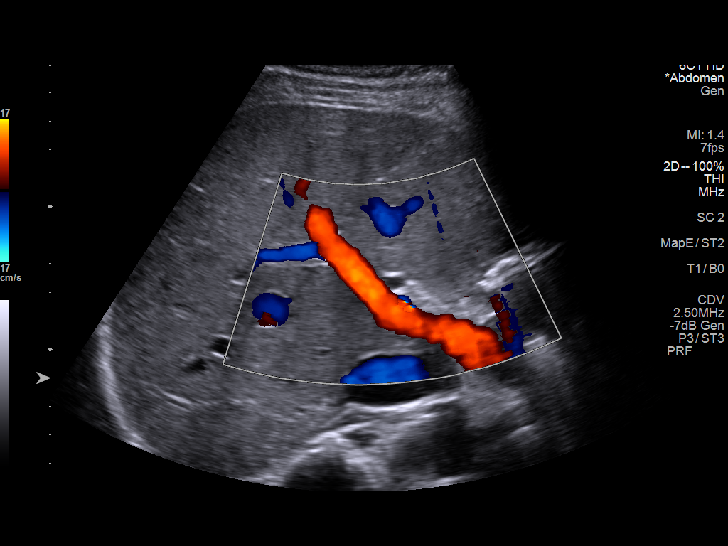
[im 45/45]
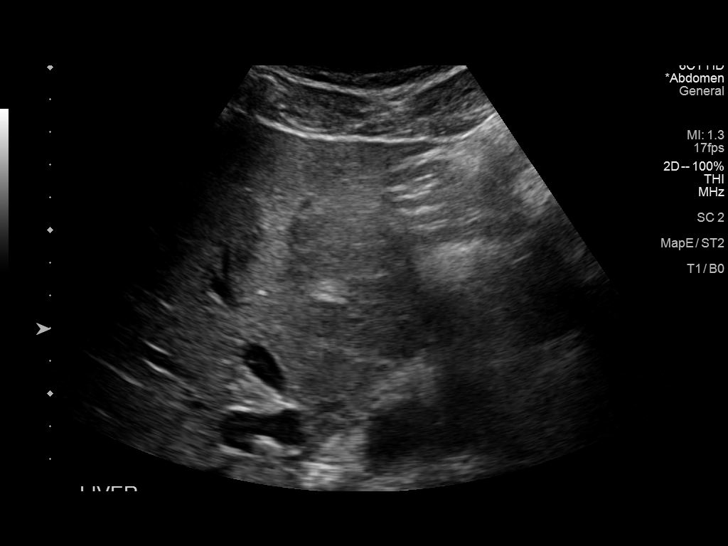

[14 of 25 positions shown; findings below may reference images not displayed]

FINDINGS: Gallbladder:

Normally distended without stones or wall thickening. No
pericholecystic fluid or sonographic Murphy sign. 3 mm gallbladder
polyp identified on the nondependent wall.

Common bile duct:

Diameter: 4 mm, normal

Liver:

Normal echogenicity without mass or nodularity. No intrahepatic
biliary dilatation. Portal vein is patent on color Doppler imaging
with normal direction of blood flow towards the liver.

Other: No RIGHT upper quadrant free fluid.
IMPRESSION: 3 mm gallbladder polyp; no further workup required.

Remainder of exam normal.

## 2021-07-05 ENCOUNTER — Ambulatory Visit: Payer: 59 | Admitting: Podiatry

## 2021-07-05 ENCOUNTER — Other Ambulatory Visit: Payer: Self-pay

## 2021-07-05 DIAGNOSIS — L600 Ingrowing nail: Secondary | ICD-10-CM

## 2021-07-05 DIAGNOSIS — D2371 Other benign neoplasm of skin of right lower limb, including hip: Secondary | ICD-10-CM

## 2021-07-05 MED ORDER — NEOMYCIN-POLYMYXIN-HC 1 % OT SOLN: OTIC | 1 refills | Status: DC

## 2021-07-05 NOTE — Patient Instructions (Signed)

## 2021-07-08 NOTE — Progress Notes (Signed)
Subjective:  Patient ID: Earl Hart, male    DOB: 01-Apr-1971,  MRN: [id number] HPI Chief Complaint  Patient presents with   Ingrown Toenail    Right lateral side of toe- on and off pain  Tried to take care of it by soaking his foot-     Callouses    Right under 5th toe     50 y.o. male presents with the above complaint.   ROS: Denies fever chills nausea vomiting muscle aches pains calf pain back pain chest pain shortness of breath.  Past Medical History:  Diagnosis Date   Anxiety    H. pylori infection    Hemorrhoids    Hypertension    Multiple thyroid nodules    Thyroid disease    surgery planned 09/2019, with surgeon stated tracheal deviation    Past Surgical History:  Procedure Laterality Date   COLONOSCOPY  08/16/2019   THYROIDECTOMY N/A 09/16/2019   Procedure: TOTAL THYROIDECTOMY;  Surgeon: Armandina Gemma, MD;  Location: [facility];  Service: General;  Laterality: N/A;   UPPER GASTROINTESTINAL ENDOSCOPY  08/16/2019    Current Outpatient Medications:    NEOMYCIN-POLYMYXIN-HYDROCORTISONE (CORTISPORIN) 1 % SOLN OTIC solution, Apply 1-2 drops to toe BID after soaking, Disp: 10 mL, Rfl: 1   amLODipine (NORVASC) 5 MG tablet, Take 1 tablet (5 mg total) by mouth daily., Disp: 90 tablet, Rfl: 1   FLUoxetine (PROZAC WEEKLY) 90 MG DR capsule, Take 90 mg by mouth every 7 (seven) days. On Mondays, Disp: , Rfl:    levothyroxine (SYNTHROID) 125 MCG tablet, Take 1 tablet (125 mcg total) by mouth daily before breakfast. (Patient taking differently: Take 150 mcg by mouth daily before breakfast.), Disp: 30 tablet, Rfl: 2   nebivolol (BYSTOLIC) 10 MG tablet, Take 10 mg by mouth daily., Disp: , Rfl:   Allergies  Allergen Reactions   Lisinopril     Other reaction(s): Unknown   Lisinopril-Hydrochlorothiazide    Penicillins Rash    Did it involve swelling of the face/tongue/throat, SOB, or low BP? No Did it involve sudden or severe rash/hives, skin peeling, or any reaction on the inside  of your mouth or nose? No Did you need to seek medical attention at a hospital or doctor's office? Yes When did it last happen?   in your 30s    If all above answers are "NO", may proceed with cephalosporin use.    Review of Systems Objective:  There were no vitals filed for this visit.  General: Well developed, nourished, in no acute distress, alert and oriented x3   Dermatological: Skin is warm, dry and supple bilateral. Nails x 10 are well maintained; remaining integument appears unremarkable at this time. There are no open sores, no preulcerative lesions, no rash or signs of infection present.  Mild benign skin lesions beneath the fifth metatarsal right.  Demonstrated as a porokeratosis does not appear to be verrucoid nature.  Ingrown toenail tibial border hallux right.  No erythema cellulitis drainage or odor associated with the just tenderness.  Vascular: Dorsalis Pedis artery and Posterior Tibial artery pedal pulses are 2/4 bilateral with immedate capillary fill time. Pedal hair growth present. No varicosities and no lower extremity edema present bilateral.   Neruologic: Grossly intact via light touch bilateral. Vibratory intact via tuning fork bilateral. Protective threshold with Semmes Wienstein monofilament intact to all pedal sites bilateral. Patellar and Achilles deep tendon reflexes 2+ bilateral. No Babinski or clonus noted bilateral.   Musculoskeletal: No gross boney pedal deformities bilateral.  No pain, crepitus, or limitation noted with foot and ankle range of motion bilateral. Muscular strength 5/5 in all groups tested bilateral.  Gait: Unassisted, Nonantalgic.    Radiographs:  None taken  Assessment & Plan:   Assessment: Benign skin lesions of fifth met head right.  Ingrown toenail right.  Plan: Chemical matricectomy was performed today also performed a debridement of the benign skin lesion.  He tolerated both procedures well after local anesthetic was administered  to the hallux.  He was given both oral written home-going instruction for the care and soaking of the toe as well as a prescription for Corticosporin otic.  I will follow-up with him in about 2 weeks to make sure he is doing well complications he will notify us immediately.     Nakyiah Kuck T. [place], [place]

## 2021-07-10 ENCOUNTER — Ambulatory Visit: Payer: 59 | Admitting: Cardiovascular Disease

## 2021-07-17 ENCOUNTER — Ambulatory Visit (INDEPENDENT_AMBULATORY_CARE_PROVIDER_SITE_OTHER): Payer: 59 | Admitting: Podiatry

## 2021-07-17 ENCOUNTER — Other Ambulatory Visit: Payer: Self-pay

## 2021-07-17 DIAGNOSIS — L600 Ingrowing nail: Secondary | ICD-10-CM

## 2021-07-17 DIAGNOSIS — Z9889 Other specified postprocedural states: Secondary | ICD-10-CM

## 2021-07-18 NOTE — Progress Notes (Signed)
He presents today for nail check denies fever chills nausea run muscle aches pain states that he continue to soak until last week.  States that the toe is still tender with some soreness as he refers to the right hallux.  Objective: Vital signs are stable alert oriented x3 fibular border of the right hallux proximally does demonstrate some mild erythema no purulence no malodor just some mild tenderness.  Assessment: Well-healing surgical toe hallux right.  Plan: Encouraged him to soak strong Epson salts and warm water until the tenderness has resolved he will notify us with any worsening.  Otherwise follow-up with him as needed.

## 2021-08-20 ENCOUNTER — Other Ambulatory Visit: Payer: Self-pay | Admitting: *Deleted

## 2021-08-20 ENCOUNTER — Encounter: Payer: Self-pay | Admitting: *Deleted

## 2021-08-21 ENCOUNTER — Telehealth: Payer: Self-pay | Admitting: Gastroenterology

## 2021-08-22 ENCOUNTER — Other Ambulatory Visit: Payer: Self-pay

## 2021-08-22 ENCOUNTER — Ambulatory Visit: Payer: 59 | Admitting: Diagnostic Neuroimaging

## 2021-08-22 ENCOUNTER — Encounter: Payer: Self-pay | Admitting: Diagnostic Neuroimaging

## 2021-08-22 VITALS — BP 140/89 | HR 59 | Ht 71.0 in | Wt 210.5 lb

## 2021-08-22 DIAGNOSIS — M5416 Radiculopathy, lumbar region: Secondary | ICD-10-CM

## 2021-08-22 DIAGNOSIS — R253 Fasciculation: Secondary | ICD-10-CM

## 2021-08-22 NOTE — Telephone Encounter (Signed)
Please schedule office visit to evaluate, may have anal fissure based on symptoms. Thanks

## 2021-08-22 NOTE — Patient Instructions (Signed)
  FASCICULATIONS (lower extremity; likely benign fasciculations; EMG/NCS showed mild tibial motor neuropathies; neuropathy labs unremarkable) - check MRI lumbar spine (rule out radiculopathies) - consider repeat EMG/NCS if symptoms progress - consider MRI brain in future if symptoms progress

## 2021-08-22 NOTE — Telephone Encounter (Signed)
Patient reports a new hemorrhoid. He has been very careful to keep bowel movements soft, not straining or sitting for a long time. He has been doing self care and using suppositories and the Recticare for several weeks. He is feeling the uncomfortable burning and "sitting sideways. He is asking to be scheduled for hem band. Please advise.

## 2021-08-22 NOTE — Progress Notes (Signed)
[facility]  PATIENT: Earl Hart DOB: [birth year]  REFERRING CLINICIAN: Tisovec, Fransico Him, MD HISTORY FROM: patient REASON FOR VISIT: new consult   HISTORICAL  CHIEF COMPLAINT:  Chief Complaint  Patient presents with   Follow-up    Rm 6 alone here for consult on worsening bilateral leg pain more so in the right leg. Pt reports sx have been present since august of this year.    HISTORY OF PRESENT ILLNESS:   50 year old male here for evaluation of cramps in legs and feet.  Symptoms started August 2022.  Symptoms are worse in the right compared to left side.  No specific triggering or aggravating factors.  He does stay fairly active with exercise on almost a daily basis.  He can see muscle twitching in his calves and toes.  No problems in upper extremities.  Lab testing have been unremarkable.  REVIEW OF SYSTEMS: Full 14 system review of systems performed and negative with exception of: as per HPI.  ALLERGIES: Allergies  Allergen Reactions   Lisinopril     Other reaction(s): Unknown   Lisinopril-Hydrochlorothiazide    Penicillins Rash    Did it involve swelling of the face/tongue/throat, SOB, or low BP? No Did it involve sudden or severe rash/hives, skin peeling, or any reaction on the inside of your mouth or nose? No Did you need to seek medical attention at a hospital or doctor's office? Yes When did it last happen?   in your 30s    If all above answers are "NO", may proceed with cephalosporin use.     HOME MEDICATIONS: Outpatient Medications Prior to Visit  Medication Sig Dispense Refill   amLODipine (NORVASC) 10 MG tablet 10 mg daily.     FLUoxetine (PROZAC WEEKLY) 90 MG DR capsule Take 90 mg by mouth every 7 (seven) days. On Mondays     levothyroxine (SYNTHROID) 125 MCG tablet Take 1 tablet (125 mcg total) by mouth daily before breakfast. (Patient taking differently: Take 150 mcg by mouth daily before breakfast.) 30 tablet 2   nebivolol  (BYSTOLIC) 10 MG tablet Take 10 mg by mouth daily.     NEOMYCIN-POLYMYXIN-HYDROCORTISONE (CORTISPORIN) 1 % SOLN OTIC solution Apply 1-2 drops to toe BID after soaking 10 mL 1   No facility-administered medications prior to visit.    PAST MEDICAL HISTORY: Past Medical History:  Diagnosis Date   Anxiety    Depression    Essential tremor    H. pylori infection    Hemorrhoids    Hypertension    IBS (irritable bowel syndrome)    dumping syndrome   Multiple thyroid nodules    Thyroid disease    surgery planned 09/2019, with surgeon stated tracheal deviation     PAST SURGICAL HISTORY: Past Surgical History:  Procedure Laterality Date   COLONOSCOPY  08/16/2019   THYROIDECTOMY N/A 09/16/2019   Procedure: TOTAL THYROIDECTOMY;  Surgeon: Armandina Gemma, MD;  Location: [facility];  Service: General;  Laterality: N/A;   UPPER GASTROINTESTINAL ENDOSCOPY  08/16/2019    FAMILY HISTORY: Family History  Problem Relation Age of Onset   Hypertension Mother    Hypertension Father    Stroke Father    Esophageal cancer Neg Hx    Pancreatic cancer Neg Hx    Stomach cancer Neg Hx    Rectal cancer Neg Hx    Liver disease Neg Hx    Liver cancer Neg Hx    Colon cancer Neg Hx    Colon polyps Neg Hx  SOCIAL HISTORY: Social History   Socioeconomic History   Marital status: Single    Spouse name: Not on file   Number of children: 0   Years of education: Not on file   Highest education level: Associate degree: academic program  Occupational History   Not on file  Tobacco Use   Smoking status: Never   Smokeless tobacco: Never  Vaping Use   Vaping Use: Never used  Substance and Sexual Activity   Alcohol use: No   Drug use: Never   Sexual activity: Not on file  Other Topics Concern   Not on file  Social History Narrative   Caffeine- 1 cup per day   Right handed   Live at home alone       MB RN 08/22/2021   Social Determinants of Health   Financial Resource Strain: Not on file   Food Insecurity: Not on file  Transportation Needs: Not on file  Physical Activity: Not on file  Stress: Not on file  Social Connections: Not on file  Intimate Partner Violence: Not on file     PHYSICAL EXAM  GENERAL EXAM/CONSTITUTIONAL: Vitals:  Vitals:   08/22/21 0834  BP: 140/89  Pulse: (!) 59  Weight: 210 lb 8 oz (95.5 kg)  Height: 5\' 11"  (1.803 m)   Body mass index is 29.36 kg/m. Wt Readings from Last 3 Encounters:  08/22/21 210 lb 8 oz (95.5 kg)  05/04/21 203 lb (92.1 kg)  03/21/21 199 lb 12.8 oz (90.6 kg)   Patient is in no distress; well developed, nourished and groomed; neck is supple  CARDIOVASCULAR: Examination of carotid arteries is normal; no carotid bruits Regular rate and rhythm, no murmurs Examination of peripheral vascular system by observation and palpation is normal  EYES: Ophthalmoscopic exam of optic discs and posterior segments is normal; no papilledema or hemorrhages No results found.  MUSCULOSKELETAL: Gait, strength, tone, movements noted in Neurologic exam below  NEUROLOGIC: MENTAL STATUS:  No flowsheet data found. awake, alert, oriented to person, place and time recent and remote memory intact normal attention and concentration language fluent, comprehension intact, naming intact fund of knowledge appropriate  CRANIAL NERVE:  2nd - no papilledema on fundoscopic exam 2nd, 3rd, 4th, 6th - pupils equal and reactive to light, visual fields full to confrontation, extraocular muscles intact, no nystagmus 5th - facial sensation symmetric 7th - facial strength symmetric 8th - hearing intact 9th - palate elevates symmetrically, uvula midline 11th - shoulder shrug symmetric 12th - tongue protrusion midline  MOTOR:  normal bulk and tone, full strength in the BUE, BLE MILD-MODERATE POSTURAL AND ACTION TREMOR RARE FASCICULATIONS IN BILATERAL CALVES  SENSORY:  normal and symmetric to light touch, pinprick, temperature,  vibration  COORDINATION:  finger-nose-finger, fine finger movements normal  REFLEXES:  deep tendon reflexes present and symmetric  GAIT/STATION:  narrow based gait     DIAGNOSTIC DATA (LABS, IMAGING, TESTING) - I reviewed patient records, labs, notes, testing and imaging myself where available.  Lab Results  Component Value Date   WBC 10.8 (H) 10/08/2019   HGB 14.6 10/08/2019   HCT 45.0 10/08/2019   MCV 83.6 10/08/2019   PLT 221 10/08/2019      Component Value Date/Time   NA 138 10/08/2019 2048   K 3.6 10/08/2019 2048   CL 103 10/08/2019 2048   CO2 27 10/08/2019 2048   GLUCOSE 118 (H) 10/08/2019 2048   BUN 27 (H) 10/08/2019 2048   CREATININE 1.42 (H) 10/08/2019 2048  CALCIUM 9.5 10/08/2019 2048   PROT 7.2 10/08/2019 2048   ALBUMIN 4.1 10/08/2019 2048   AST 38 10/08/2019 2048   ALT 33 10/08/2019 2048   ALKPHOS 73 10/08/2019 2048   BILITOT 0.5 10/08/2019 2048   GFRNONAA 58 (L) 10/08/2019 2048   GFRAA >60 10/08/2019 2048   No results found for: CHOL, HDL, LDLCALC, LDLDIRECT, TRIG, CHOLHDL No results found for: HGBA1C No results found for: VITAMINB12 No results found for: TSH  Labs  - CK 416, ANA neg, B12 558, TSH 0.684, FT4 1.117, A1c 5.4  07/04/21 EMG / NCS ([facility]) - Electrodiagnostic evidence of a bilateral tibial neuropathy with axonal features in the bilateral lower extremities. No electrodiagnostic evidence of an entrapment neuropathy in the bilateral lower extremities.  - Needle study was limited to the left medial gastrocnemius and tibial muscles, as the patient deferred to continue the study due to pain. Appropriate recruitment and insertional activity was noted in the respective muscles, with no fasciculations or denervation noted.  - Unable to rule in or rule out lumbosacral radiculopathy due to limited needle EMG study in the left lower extremity.     ASSESSMENT AND PLAN  50 y.o. year old male here with:   Dx:  1. Benign fasciculations    2. Lumbar radiculopathy       PLAN:  FASCICULATIONS (lower extremity; otherwise normal exam and strength; may represent benign fasciculations; EMG/NCS showed mild tibial motor neuropathies; neuropathy labs unremarkable) - check MRI lumbar spine (rule out radiculopathies) - consider repeat EMG/NCS if symptoms progress - consider MRI brain in future if symptoms progress  Orders Placed This Encounter  Procedures   MR [place]   Return for pending test results, pending if symptoms worsen or fail to improve.    Penni Bombard, MD 51/0/2585, 2:77 AM Certified in Neurology, Neurophysiology and Neuroimaging  [facility] [address] [place], [place] [address] [phone]

## 2021-08-22 NOTE — Telephone Encounter (Signed)
Inbound call from patient wanting to discuss medical issue and treatment plan he is going through. Patient would not go into detail just wanted to speak with a nurse

## 2021-08-22 NOTE — Telephone Encounter (Signed)
Explained to the patient. Appointment scheduled in a banding slot.

## 2021-08-27 ENCOUNTER — Ambulatory Visit: Payer: 59 | Admitting: Gastroenterology

## 2021-08-27 ENCOUNTER — Encounter: Payer: Self-pay | Admitting: Gastroenterology

## 2021-08-27 ENCOUNTER — Encounter: Payer: Self-pay | Admitting: Diagnostic Neuroimaging

## 2021-08-27 VITALS — BP 120/72 | HR 60 | Ht 71.0 in | Wt 211.2 lb

## 2021-08-27 DIAGNOSIS — K602 Anal fissure, unspecified: Secondary | ICD-10-CM

## 2021-08-27 DIAGNOSIS — L290 Pruritus ani: Secondary | ICD-10-CM

## 2021-08-27 DIAGNOSIS — K6289 Other specified diseases of anus and rectum: Secondary | ICD-10-CM

## 2021-08-27 DIAGNOSIS — K625 Hemorrhage of anus and rectum: Secondary | ICD-10-CM | POA: Diagnosis not present

## 2021-08-27 MED ORDER — AMBULATORY NON FORMULARY MEDICATION: 0 refills | Status: DC

## 2021-08-27 NOTE — Patient Instructions (Addendum)
Follow up in 3 months in the office.  Please purchase the following medications over the counter and take as directed: Recticare  We have sent a prescription for nitroglycerin 0.125% gel to [facility]. You should apply a pea size amount to your rectum three times daily x 6-8 weeks.  [facility]'s information is below: Address: [address], [place], [place] [address]  [phone]  *Please DO NOT go directly from our office to pick up this medication! Give the pharmacy 1 day to process the prescription as this is compounded and takes time to make. _________________________________________________________________________________  If you are age 12 or older, your body mass index should be between 23-30. Your Body mass index is 29.46 kg/m. If this is out of the aforementioned range listed, please consider follow up with your Primary Care Provider.  If you are age 22 or younger, your body mass index should be between 19-25. Your Body mass index is 29.46 kg/m. If this is out of the aformentioned range listed, please consider follow up with your Primary Care Provider.   ________________________________________________________  The [place] [facility] providers would like to encourage you to use [facility] to communicate with providers for non-urgent requests or questions.  Due to long hold times on the telephone, sending your provider a message by [facility] may be a faster and more efficient way to get a response.  Please allow 48 business hours for a response.  Please remember that this is for non-urgent requests.  _______________________________________________________  Due to recent changes in healthcare laws, you may see the results of your imaging and laboratory studies on MyChart before your provider has had a chance to review them.  We understand that in some cases there may be results that are confusing or concerning to you. Not all laboratory results come back in the same  time frame and the provider may be waiting for multiple results in order to interpret others.  Please give [place] 48 hours in order for your provider to thoroughly review all the results before contacting the office for clarification of your results.

## 2021-08-27 NOTE — Progress Notes (Signed)
Earl Hart    [id number]    Mar 12, 1971  Primary Care Physician:Tisovec, Fransico Him, MD  Referring Physician: Haywood Pao, MD [address] [place],  [place] [address]   Chief complaint:  Hemorrhoids, rectal discomfort  HPI: 50 yr old very pleasant gentleman  s/p hemorrhoidal band ligation here for follow-up with complaints of rectal bleeding, burning sensation and itching. Last month he had an episode of rectal bleeding associated with significant rectal discomfort, he was not able to sit down comfortably straight, he had to move from side to side.  He has been using RectiCare with some improvement but he continues to have itching and burning sensation.  Denies any rectal discomfort after bowel movement.    He is s/p hemorrhoidal band ligation.  Colonoscopy August 16, 2019: 2 small sessile hyperplastic polyps removed.  Internal hemorrhoids.   EGD August 16, 2019: Gastritis, biopsy negative for H. pylori infection, dysplasia or intestinal metaplasia   Outpatient Encounter Medications as of 08/27/2021  Medication Sig   amLODipine (NORVASC) 5 MG tablet Take 5 mg by mouth daily.   FLUoxetine (PROZAC WEEKLY) 90 MG DR capsule Take 90 mg by mouth every 7 (seven) days. On Mondays   levothyroxine (SYNTHROID) 125 MCG tablet Take 1 tablet (125 mcg total) by mouth daily before breakfast. (Patient taking differently: Take 150 mcg by mouth daily before breakfast.)   nebivolol (BYSTOLIC) 10 MG tablet Take 10 mg by mouth daily.   [DISCONTINUED] amLODipine (NORVASC) 10 MG tablet 10 mg daily.   No facility-administered encounter medications on file as of 08/27/2021.    Allergies as of 08/27/2021 - Review Complete 08/27/2021  Allergen Reaction Noted   Lisinopril  05/25/2021   Lisinopril-hydrochlorothiazide  04/25/2020   Penicillins Rash 07/06/2013    Past Medical History:  Diagnosis Date   Anxiety    Depression    Essential tremor    H. pylori infection     Hemorrhoids    Hypertension    IBS (irritable bowel syndrome)    dumping syndrome   Multiple thyroid nodules    Thyroid disease    surgery planned 09/2019, with surgeon stated tracheal deviation     Past Surgical History:  Procedure Laterality Date   COLONOSCOPY  08/16/2019   THYROIDECTOMY N/A 09/16/2019   Procedure: TOTAL THYROIDECTOMY;  Surgeon: Armandina Gemma, MD;  Location: [facility];  Service: General;  Laterality: N/A;   UPPER GASTROINTESTINAL ENDOSCOPY  08/16/2019    Family History  Problem Relation Age of Onset   Hypertension Mother    Hypertension Father    Stroke Father    Esophageal cancer Neg Hx    Pancreatic cancer Neg Hx    Stomach cancer Neg Hx    Rectal cancer Neg Hx    Liver disease Neg Hx    Liver cancer Neg Hx    Colon cancer Neg Hx    Colon polyps Neg Hx     Social History   Socioeconomic History   Marital status: Single    Spouse name: Not on file   Number of children: 0   Years of education: Not on file   Highest education level: Associate degree: academic program  Occupational History   Not on file  Tobacco Use   Smoking status: Never   Smokeless tobacco: Never  Vaping Use   Vaping Use: Never used  Substance and Sexual Activity   Alcohol use: No   Drug use: Never  Sexual activity: Not on file  Other Topics Concern   Not on file  Social History Narrative   Caffeine- 1 cup per day   Right handed   Live at home alone       MB RN 08/22/2021   Social Determinants of Health   Financial Resource Strain: Not on file  Food Insecurity: Not on file  Transportation Needs: Not on file  Physical Activity: Not on file  Stress: Not on file  Social Connections: Not on file  Intimate Partner Violence: Not on file      Review of systems: All other review of systems negative except as mentioned in the HPI.   Physical Exam: Vitals:   08/27/21 1552  BP: 120/72  Pulse: 60   Body mass index is 29.46 kg/m. Gen:      No acute  distress HEENT:  sclera anicteric Abd:      soft, non-tender; no palpable masses, no distension Ext:    No edema Neuro: alert and oriented x 3 Psych: normal mood and affect Rectal exam: Increased anal sphincter tone with tenderness, no external hemorrhoids Anoscopy: Posterior diminutive anal fissure, otherwise unremarkable exam, no active bleeding, normal dentate line, no visible nodules  Data Reviewed:  Reviewed labs, radiology imaging, old records and pertinent past GI work up   Assessment and Plan/Recommendations:  50 year old very pleasant gentleman with symptomatic hemorrhoids s/p hemorrhoidal band ligation with complaints of rectal discomfort and burning sensation secondary to anal fissure  Use 0.125% nitroglycerin 3 times daily with RectiCare for 6 to 8 weeks Avoid using sildenafil, Cialis or any 5 phosphodiesterase inhibitors along with nitroglycerin to prevent adverse reaction Continue with high-fiber diet and increase water intake Avoid excessive straining during defecation  Follow-up in 3 months  The patient was provided an opportunity to ask questions and all were answered. The patient agreed with the plan and demonstrated an understanding of the instructions.  [name] , MD    CC: Tisovec, Fransico Him, MD

## 2021-08-28 ENCOUNTER — Telehealth: Payer: Self-pay | Admitting: Diagnostic Neuroimaging

## 2021-08-28 NOTE — Telephone Encounter (Signed)
LVM for pt to call back to schedule  [employer] auth: NPR via [facility] [employer]

## 2021-08-30 NOTE — Telephone Encounter (Signed)
Patient returned my call he is scheduled at [facility] for 09/04/21.

## 2021-08-31 ENCOUNTER — Other Ambulatory Visit: Payer: Self-pay | Admitting: Cardiovascular Disease

## 2021-09-04 ENCOUNTER — Ambulatory Visit: Payer: 59

## 2021-09-04 ENCOUNTER — Other Ambulatory Visit: Payer: Self-pay

## 2021-09-04 DIAGNOSIS — M5416 Radiculopathy, lumbar region: Secondary | ICD-10-CM | POA: Diagnosis not present

## 2021-09-19 ENCOUNTER — Encounter: Payer: Self-pay | Admitting: *Deleted

## 2021-09-21 ENCOUNTER — Ambulatory Visit: Payer: 59 | Admitting: Podiatry

## 2021-09-21 ENCOUNTER — Other Ambulatory Visit: Payer: Self-pay

## 2021-09-21 ENCOUNTER — Encounter: Payer: Self-pay | Admitting: Podiatry

## 2021-09-21 DIAGNOSIS — L03031 Cellulitis of right toe: Secondary | ICD-10-CM | POA: Diagnosis not present

## 2021-09-21 NOTE — Patient Instructions (Signed)

## 2021-09-21 NOTE — Progress Notes (Signed)
Subjective:   Patient ID: Earl Hart, male   DOB: 50 y.o.   MRN: [id number]   HPI Patient had ingrown toenail done several weeks ago and presents with inflammation and pain in the lateral border.  Stated he just started the last few weeks he does not remember injury neuro   ROS      Objective:  Physical Exam  Neurovascular status intact with patient found to have inflammation of the lateral side right hallux with no proximal edema erythema or drainage noted but pain at this juncture     Assessment:  Mid grade paronychia infection of the right hallux lateral border moderate tenderness no indication active infection     Plan:  H NP education rendered and discussed at great length and I have recommended removal of the border cleaning and flushing and allowing drainage.  I anesthetized 60 mg like Marcaine mixture sterile prep done and using sterile instrumentation I removed the lateral nail fold I remove crusted abscess tissue and I flushed the wound.  Applied sterile dressing reappoint for [place] to recheck

## 2021-11-08 ENCOUNTER — Ambulatory Visit (INDEPENDENT_AMBULATORY_CARE_PROVIDER_SITE_OTHER): Payer: 59

## 2021-11-08 ENCOUNTER — Ambulatory Visit: Payer: 59 | Admitting: Podiatry

## 2021-11-08 ENCOUNTER — Other Ambulatory Visit: Payer: Self-pay

## 2021-11-08 DIAGNOSIS — L600 Ingrowing nail: Secondary | ICD-10-CM | POA: Diagnosis not present

## 2021-11-08 DIAGNOSIS — L03031 Cellulitis of right toe: Secondary | ICD-10-CM

## 2021-11-08 NOTE — Progress Notes (Signed)

## 2021-11-08 NOTE — Patient Instructions (Signed)

## 2021-11-11 NOTE — Progress Notes (Signed)
°  Subjective:   Patient ID: Earl Hart, male   DOB: 51 y.o.   MRN: [id number]   HPI Patient presents stating he has developed a lot of discomfort in the border of his right big toenail and states it sore and makes it hard to wear shoe gear comfortably   ROS      Objective:  Physical Exam  Neuro vascular status intact with patient found to have incurvation of the right hallux nail with fluid buildup and pain but no erythema edema or drainage associated with this     Assessment:  Chronic ingrown toenail deformity right hallux      Plan:  H&P reviewed condition recommended removal of border explained procedure risk and today went ahead and I infiltrated the right hallux 60 mg like Marcaine mixture sterile prep done and using sterile instrumentation removed border exposed matrix applied phenol 3 applications 30 seconds followed by alcohol lavage sterile dressing gave instructions on soaks encourage patient to leave dressing on 24 hours but take it off earlier if needed and encouraged to call with questions concerns which may arise

## 2022-10-16 ENCOUNTER — Encounter (INDEPENDENT_AMBULATORY_CARE_PROVIDER_SITE_OTHER): Payer: 59 | Admitting: Family Medicine

## 2022-10-16 DIAGNOSIS — Z0289 Encounter for other administrative examinations: Secondary | ICD-10-CM

## 2022-11-27 ENCOUNTER — Encounter: Payer: Self-pay | Admitting: Physician Assistant

## 2022-11-27 ENCOUNTER — Ambulatory Visit: Payer: 59 | Admitting: Physician Assistant

## 2022-11-27 VITALS — BP 124/86 | HR 54 | Ht 71.0 in | Wt 208.4 lb

## 2022-11-27 DIAGNOSIS — R195 Other fecal abnormalities: Secondary | ICD-10-CM | POA: Diagnosis not present

## 2022-11-27 NOTE — Progress Notes (Signed)
Chief Complaint: Positive Hemoccult test  HPI:    Earl Hart is a 52 year old male with a past medical history as listed below including anxiety, depression, IBS and multiple others, known to Dr. Silverio Decamp, who presents to clinic today with a complaint of positive Hemoccult test.    08/16/2019 EGD with gastritis biopsies negative for H. pylori infection, dysplasia or intestinal metaplasia.    08/16/2019 colonoscopy with 2 small sessile hyperplastic polyps and internal hemorrhoids.  Repeat recommended in 10 years.    08/27/2021 patient seen in clinic by Dr. Silverio Decamp.  At that time discussed he was status post hemorrhoid band ligation there for follow-up with complaint of rectal bleeding, burning sensation and itching in the rectum.  On rectal exam that day patient had increased anal sphincter tone and a posterior diminutive anal fissure.  He was told to use 0.125% nitroglycerin 3 times daily with RectiCare for 6 to 8 weeks.    Today, the patient presents to clinic and explains that he was seen by his primary care provider and they did a Hemoccult test which he was told was positive.  He is really having no GI problems at all, he is not seeing any blood in his stool.  Tells me he battles with IBS but this is normal for him.  He manages this with fiber every other day.  No weight loss, drastic change in bowel habits, family history of colon cancer.    Denies fever, chills, abdominal pain, nausea, vomiting or GERD.  Past Medical History:  Diagnosis Date   Anxiety    Depression    Essential tremor    H. pylori infection    Hemorrhoids    Hypertension    IBS (irritable bowel syndrome)    dumping syndrome   Multiple thyroid nodules    Thyroid disease    surgery planned 09/2019, with surgeon stated tracheal deviation     Past Surgical History:  Procedure Laterality Date   COLONOSCOPY  08/16/2019   THYROIDECTOMY N/A 09/16/2019   Procedure: TOTAL THYROIDECTOMY;  Surgeon: Armandina Gemma, MD;   Location: [facility];  Service: General;  Laterality: N/A;   UPPER GASTROINTESTINAL ENDOSCOPY  08/16/2019    Current Outpatient Medications  Medication Sig Dispense Refill   amLODipine (NORVASC) 5 MG tablet TAKE 1 TABLET (5 MG TOTAL) BY MOUTH DAILY. 90 tablet 0   FLUoxetine (PROZAC WEEKLY) 90 MG DR capsule Take 90 mg by mouth every 7 (seven) days. On Mondays     nebivolol (BYSTOLIC) 10 MG tablet Take 10 mg by mouth daily.     levothyroxine (SYNTHROID) 125 MCG tablet Take 1 tablet (125 mcg total) by mouth daily before breakfast. (Patient taking differently: Take 150 mcg by mouth daily before breakfast.) 30 tablet 2   No current facility-administered medications for this visit.    Allergies as of 11/27/2022 - Review Complete 11/27/2022  Allergen Reaction Noted   Lisinopril  05/25/2021   Lisinopril-hydrochlorothiazide  04/25/2020   Penicillins Rash 07/06/2013    Family History  Problem Relation Age of Onset   Hypertension Mother    Hypertension Father    Stroke Father    Esophageal cancer Neg Hx    Pancreatic cancer Neg Hx    Stomach cancer Neg Hx    Rectal cancer Neg Hx    Liver disease Neg Hx    Liver cancer Neg Hx    Colon cancer Neg Hx    Colon polyps Neg Hx     Social History  Socioeconomic History   Marital status: Single    Spouse name: Not on file   Number of children: 0   Years of education: Not on file   Highest education level: Associate degree: academic program  Occupational History   Not on file  Tobacco Use   Smoking status: Never   Smokeless tobacco: Never  Vaping Use   Vaping Use: Never used  Substance and Sexual Activity   Alcohol use: No   Drug use: Never   Sexual activity: Not on file  Other Topics Concern   Not on file  Social History Narrative   Caffeine- 1 cup per day   Right handed   Live at home alone       MB RN 08/22/2021   Social Determinants of Health   Financial Resource Strain: Not on file  Food Insecurity: Not on file   Transportation Needs: Not on file  Physical Activity: Not on file  Stress: Not on file  Social Connections: Not on file  Intimate Partner Violence: Not on file    Review of Systems:    Constitutional: No weight loss, fever or chills Cardiovascular: No chest pain Respiratory: No SOB  Gastrointestinal: See HPI and otherwise negative   Physical Exam:  Vital signs: BP 124/86   Pulse (!) 54   Ht 5' 11"$  (1.803 m)   Wt 208 lb 6 oz (94.5 kg)   BMI 29.06 kg/m    Constitutional:   Pleasant AA male appears to be in NAD, Well developed, Well nourished, alert and cooperative  Respiratory: Respirations even and unlabored. Lungs clear to auscultation bilaterally.   No wheezes, crackles, or rhonchi.  Cardiovascular: Normal S1, S2. No MRG. Regular rate and rhythm. No peripheral edema, cyanosis or pallor.  Gastrointestinal:  Soft, nondistended, nontender. No rebound or guarding. Normal bowel sounds. No appreciable masses or hepatomegaly. Rectal:  Not performed.  Psychiatric: Demonstrates good judgement and reason without abnormal affect or behaviors.  RELEVANT LABS AND IMAGING: CBC    Component Value Date/Time   WBC 10.8 (H) 10/08/2019 2048   RBC 5.38 10/08/2019 2048   HGB 14.6 10/08/2019 2048   HCT 45.0 10/08/2019 2048   PLT 221 10/08/2019 2048   MCV 83.6 10/08/2019 2048   MCH 27.1 10/08/2019 2048   MCHC 32.4 10/08/2019 2048   RDW 14.3 10/08/2019 2048   LYMPHSABS 2.6 10/08/2019 2048   MONOABS 1.1 (H) 10/08/2019 2048   EOSABS 0.1 10/08/2019 2048   BASOSABS 0.1 10/08/2019 2048    CMP     Component Value Date/Time   NA 138 10/08/2019 2048   K 3.6 10/08/2019 2048   CL 103 10/08/2019 2048   CO2 27 10/08/2019 2048   GLUCOSE 118 (H) 10/08/2019 2048   BUN 27 (H) 10/08/2019 2048   CREATININE 1.42 (H) 10/08/2019 2048   CALCIUM 9.5 10/08/2019 2048   PROT 7.2 10/08/2019 2048   ALBUMIN 4.1 10/08/2019 2048   AST 38 10/08/2019 2048   ALT 33 10/08/2019 2048   ALKPHOS 73 10/08/2019  2048   BILITOT 0.5 10/08/2019 2048   GFRNONAA 58 (L) 10/08/2019 2048   GFRAA >60 10/08/2019 2048    Assessment: 1.  Positive Hemoccult testing: Last colonoscopy with hyperplastic polyps in 2020 and repeat recommended in 10 years, patient with known history of hemorrhoids; likelihood of colorectal cancer is very low given that patient is up-to-date on screening  Plan: 1.  Discussed with patient that his PCP should not be doing annual Hemoccult testing as his  colon cancer screening is taking care of by his colonoscopies.  There is very low likelihood that this positive Hemoccult test indicates any form of cancer or polyps.  Tried to offer reassurance. 2.  Discussed that patient should look for any overt blood in his stool, drastic change in bowel habits or unexpected weight loss.  For now patient is okay with "paying attention to his own body".  He will call though if something changes. 3.  Also discussed with the patient that if he just gets anxious thinking about this test and needs further reassurance then we can proceed with colonoscopy for diagnostic reasons.  He verbalized understanding. 4.  Patient to follow in clinic with [place] as needed.  Ellouise Newer, PA-C [facility] 11/27/2022, 9:57 AM  Cc: Haywood Pao, MD

## 2022-11-27 NOTE — Patient Instructions (Signed)
If you are age 53 or older, your body mass index should be between 23-30. Your Body mass index is 29.06 kg/m. If this is out of the aforementioned range listed, please consider follow up with your Primary Care Provider.  If you are age 60 or younger, your body mass index should be between 19-25. Your Body mass index is 29.06 kg/m. If this is out of the aformentioned range listed, please consider follow up with your Primary Care Provider.    The [place] providers would like to encourage you to use [facility] to communicate with providers for non-urgent requests or questions.  Due to long hold times on the telephone, sending your provider a message by [facility] may be a faster and more efficient way to get a response.  Please allow 48 business hours for a response.  Please remember that this is for non-urgent requests.   It was a pleasure to see you today!  Thank you for trusting me with your gastrointestinal care!    Ellouise Newer, PA-C

## 2022-12-05 IMAGING — CT CT ABD-PELV W/O CM
2 of 4 series · 12 of 46 positions shown, 14 images · non-contrast
Comparison: None.

CLINICAL DATA: Acute flank pain.

EXAM:
CT ABDOMEN AND PELVIS WITHOUT CONTRAST
TECHNIQUE: Multidetector CT imaging of the abdomen and pelvis was performed
following the standard protocol without IV contrast.

[Series 2: renal stone 5.00 br40 s3 axial · axial · 0.58mm/px · z∈[+1241,+1671]mm · 9 of 104 slices shown, 11 images]
[im 9/104  soft-tissue]
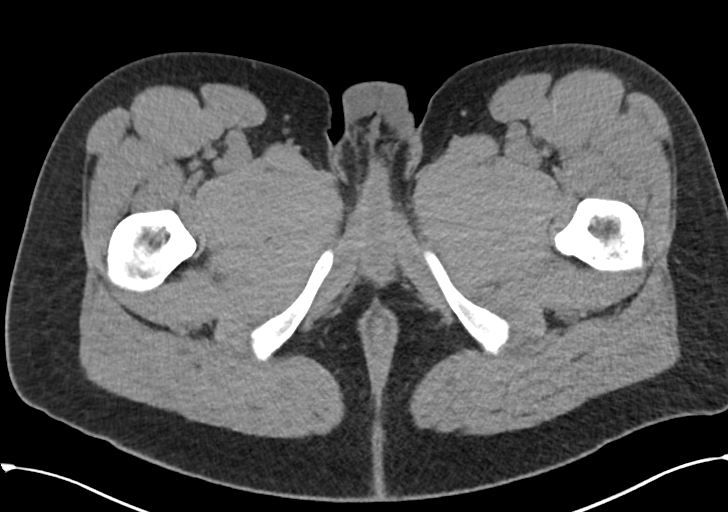
[im 9/104  bone]
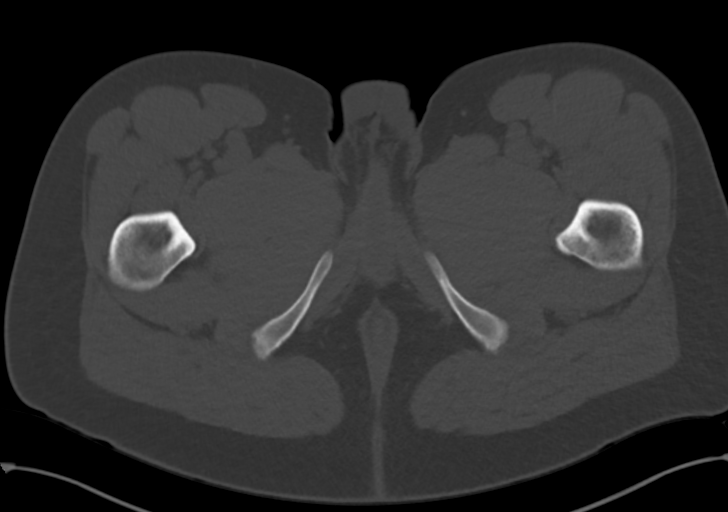
[im 18/104  soft-tissue]
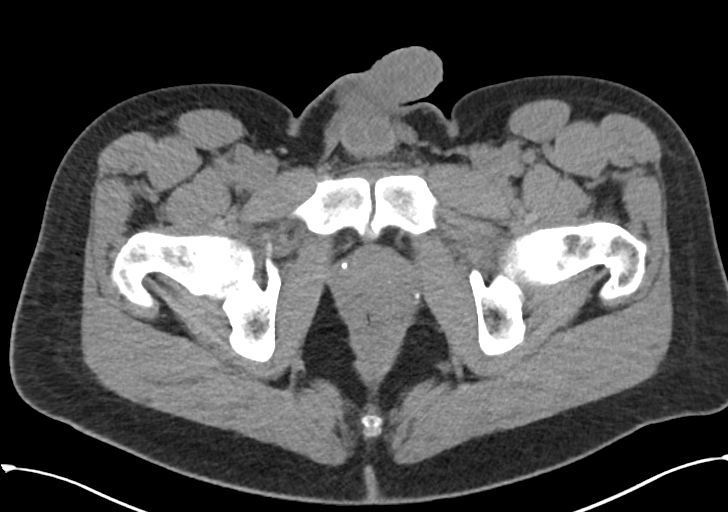
[im 31/104  soft-tissue]
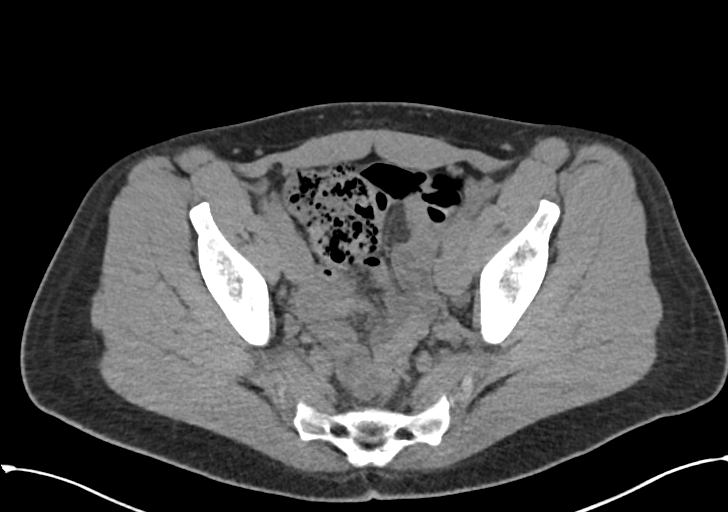
[im 39/104  soft-tissue]
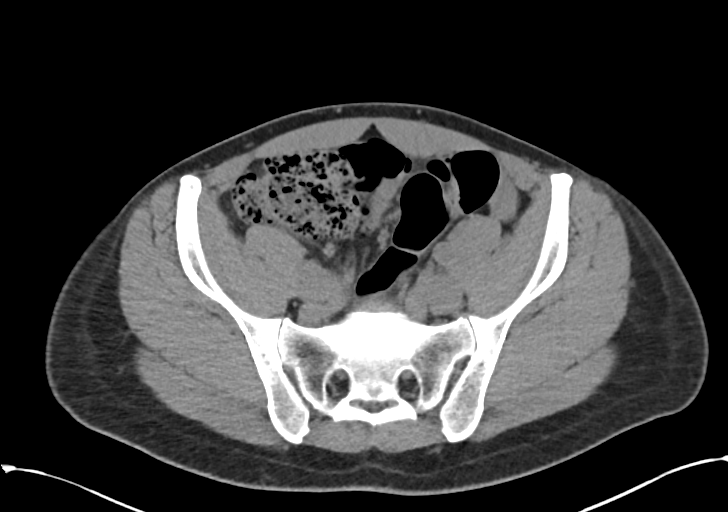
[im 52/104  soft-tissue]
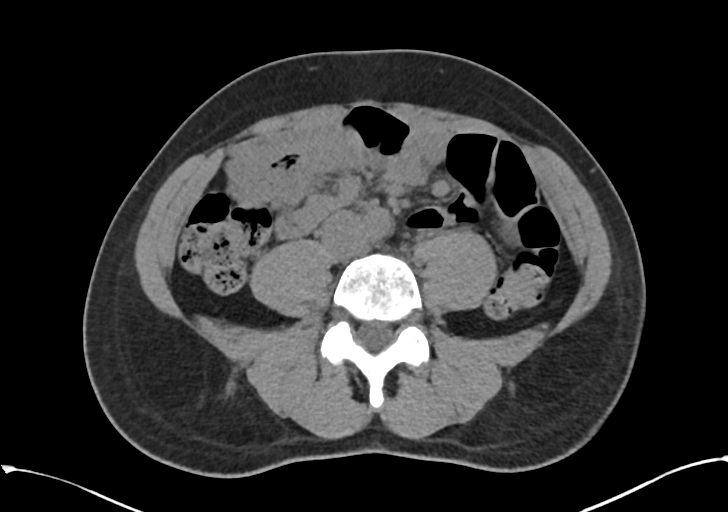
[im 65/104  soft-tissue]
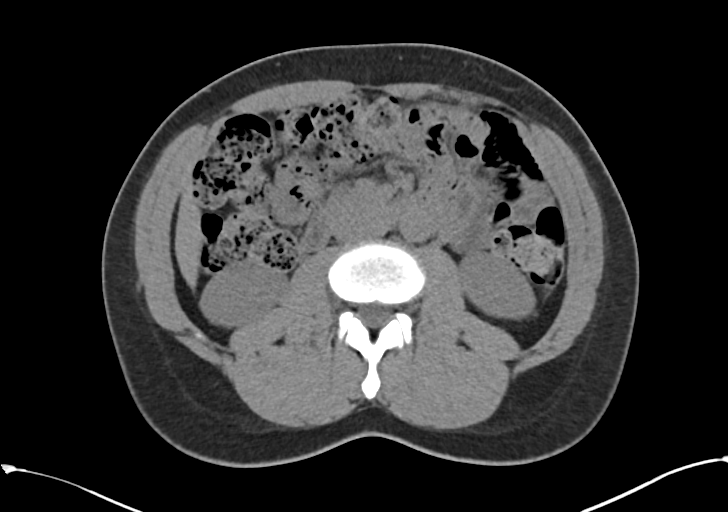
[im 73/104  soft-tissue]
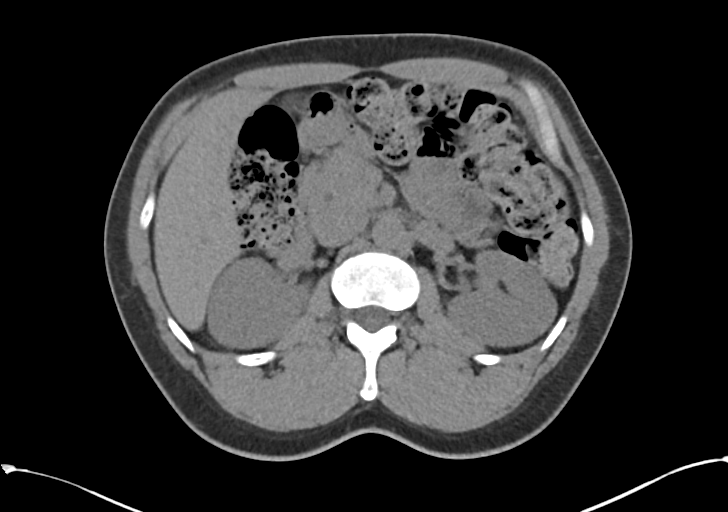
[im 86/104  soft-tissue]
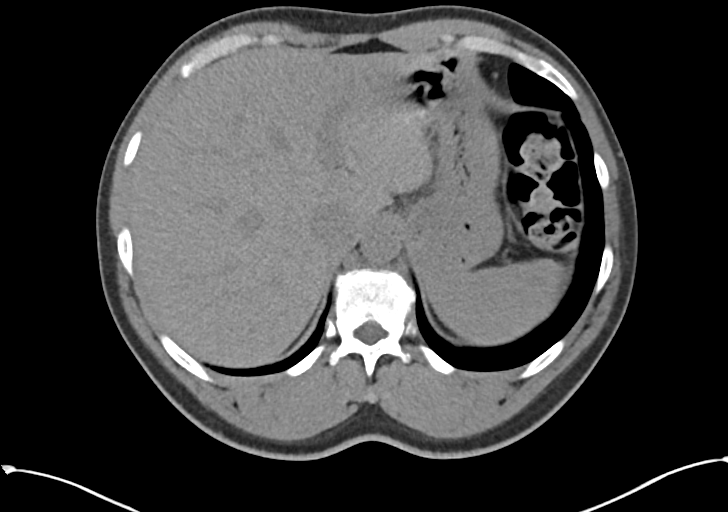
[im 95/104  soft-tissue]
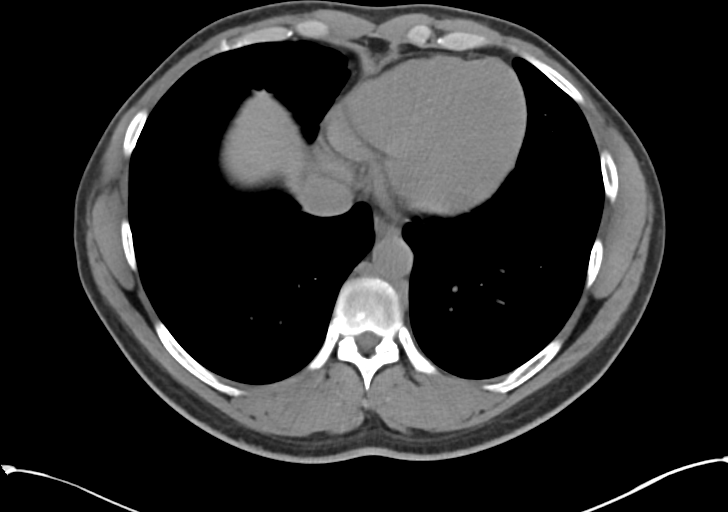
[im 95/104  bone]
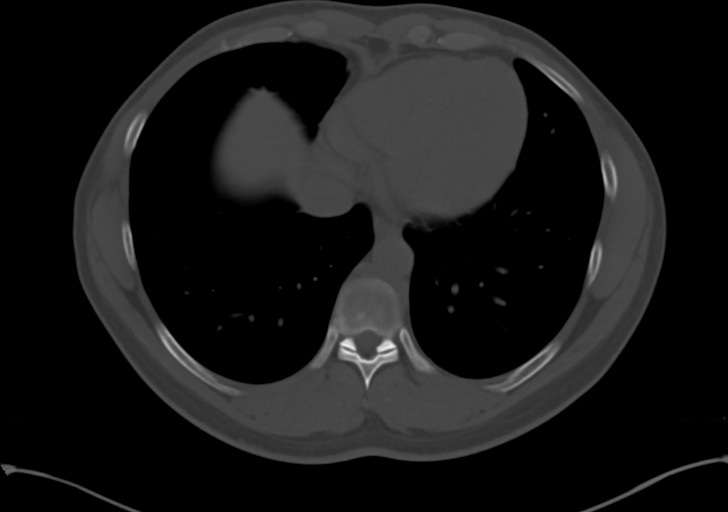

[Series 6: renal stone 2.00 br40 s3 cor · coronal · 0.83mm/px · 3 of 149 slices shown]
[im 50/149  soft-tissue]
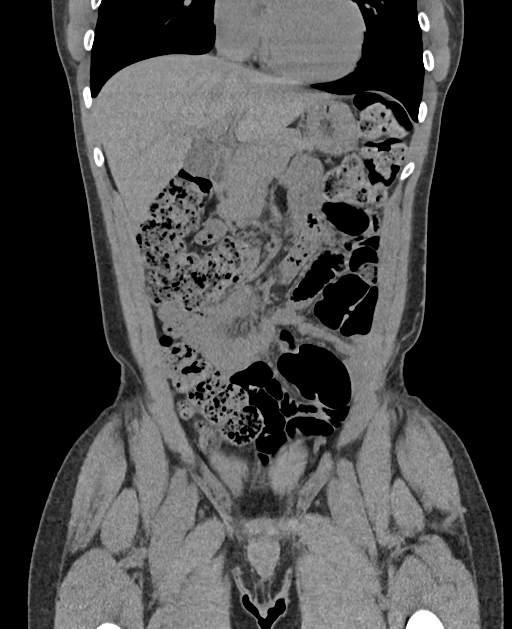
[im 66/149  soft-tissue]
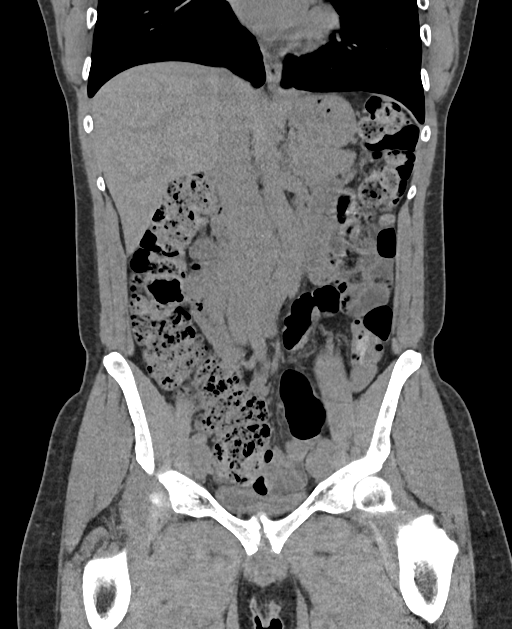
[im 83/149  soft-tissue]
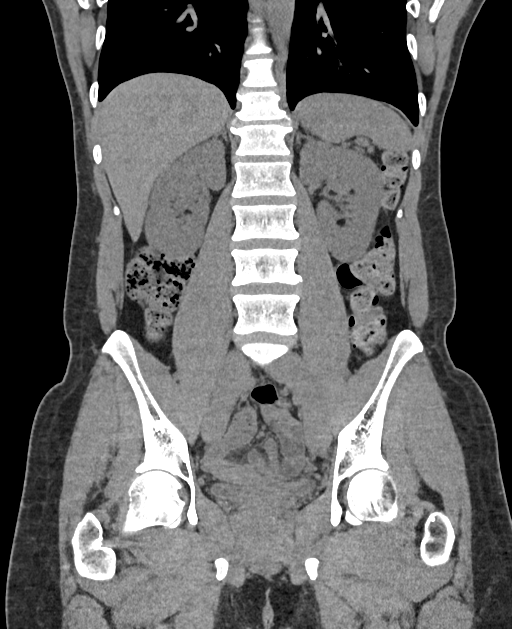

[12 of 46 positions shown; findings below may reference images not displayed]

FINDINGS: Lower chest: No acute abnormality.

Hepatobiliary: No focal liver abnormality is seen. No gallstones,
gallbladder wall thickening, or biliary dilatation.

Pancreas: Unremarkable. No pancreatic ductal dilatation or
surrounding inflammatory changes.

Spleen: Normal in size without focal abnormality.

Adrenals/Urinary Tract: Adrenal glands are unremarkable. Kidneys are
normal, without renal calculi, focal lesion, or hydronephrosis.
Bladder is unremarkable.

Stomach/Bowel: The stomach appears normal. There is no evidence of
bowel obstruction or inflammation. Stool seen throughout the colon.
The appendix is not clearly visualized.

Vascular/Lymphatic: No significant vascular findings are present. No
enlarged abdominal or pelvic lymph nodes.

Reproductive: Prostate is unremarkable.

Other: No abdominal wall hernia or abnormality. No abdominopelvic
ascites.

Musculoskeletal: No acute or significant osseous findings.
IMPRESSION: 1. Stool seen throughout the colon.
2. No other abnormality seen in the abdomen or pelvis.

## 2023-01-28 NOTE — Progress Notes (Signed)
Reviewed and agree with documentation and assessment and plan. K. Veena Angelea Penny , MD   

## 2024-05-07 ENCOUNTER — Telehealth: Payer: Self-pay | Admitting: Gastroenterology

## 2024-05-07 MED ORDER — PANTOPRAZOLE SODIUM 40 MG PO TBEC: 40.0000 mg | DELAYED_RELEASE_TABLET | Freq: Every day | ORAL | 0 refills | Status: DC

## 2024-05-07 NOTE — Telephone Encounter (Signed)
 Patient called and stated that he needed a refill on his pantoprazole . Patient is requesting a call back. Please advise.

## 2024-05-07 NOTE — Telephone Encounter (Signed)
 Patient has not been prescribed Pantoprazole  since 2022 by our office. I will send in 1 refill but he needs an office appointment before any further refills  Pantoprazole  40 mg 1 po BID    Spoke with patient and he knew he hadn't had the pantoprazole  refilled since 2022 before I had a chance to tell him,  because he said he hasn't needed it. He realizes he probably needs to be seen.  I refilled his medication, added back to his med list and made his appointment to see Dr Shila  on 9/30

## 2024-07-13 ENCOUNTER — Ambulatory Visit: Admitting: Gastroenterology

## 2024-07-30 ENCOUNTER — Other Ambulatory Visit: Payer: Self-pay | Admitting: Gastroenterology

## 2024-08-24 ENCOUNTER — Ambulatory Visit: Admitting: Physician Assistant

## 2024-10-25 ENCOUNTER — Telehealth: Payer: Self-pay | Admitting: Physician Assistant

## 2024-10-25 NOTE — Telephone Encounter (Signed)
 Inbound call from patient stating he would like to speak to nurse in regards to if something can be called in for him for hemorrhoids. Patient is scheduled for an office visit on 11/15/24. Please advise  Thank you

## 2024-10-25 NOTE — Telephone Encounter (Signed)
 Spoke with the pt and discussed sitz baths, prep H and tucks pads.  He will keep appt as planned and keep the bowels soft in the meantime. He will call PCP if he does not think he can wait until appt in Feb.  He will call back if things worsen.

## 2024-11-12 NOTE — Progress Notes (Unsigned)
 "  Chief Complaint: Hemorrhoids  HPI:    [name] is a 54 year old African-American male, known to Dr. Shila, with a past medical history as listed below including IBS, who was referred to me by Tisovec, Charlie ORN, MD for a complaint of hemorrhoids.      08/16/2019 EGD with normal esophagus, gastritis and normal duodenum.  Biopsy showed chronic inactive gastritis.    08/16/2019 colonoscopy with fair prep of the colon, 1 less than 1 mm polyp in the ascending colon, one 3 mm polyp in the transverse colon nonbleeding internal hemorrhoids.  Pathology showed hyperplastic polyp and repeat recommended 10 years.    12/15/2020 CTAP without contrast showed stool in the colon otherwise and normal.    10/25/2024 patient called in about hemorrhoids.  Past Medical History:  Diagnosis Date   Anxiety    Depression    Essential tremor    H. pylori infection    Hemorrhoids    Hypertension    IBS (irritable bowel syndrome)    dumping syndrome   Multiple thyroid  nodules    Thyroid  disease    surgery planned 09/2019, with surgeon stated tracheal deviation     Past Surgical History:  Procedure Laterality Date   COLONOSCOPY  08/16/2019   THYROIDECTOMY N/A 09/16/2019   Procedure: TOTAL THYROIDECTOMY;  Surgeon: Eletha Boas, MD;  Location: [facility];  Service: General;  Laterality: N/A;   UPPER GASTROINTESTINAL ENDOSCOPY  08/16/2019    Current Outpatient Medications  Medication Sig Dispense Refill   amLODipine  (NORVASC ) 5 MG tablet TAKE 1 TABLET (5 MG TOTAL) BY MOUTH DAILY. 90 tablet 0   FLUoxetine (PROZAC WEEKLY) 90 MG DR capsule Take 90 mg by mouth every 7 (seven) days. On Mondays     levothyroxine  (SYNTHROID ) 125 MCG tablet Take 1 tablet (125 mcg total) by mouth daily before breakfast. (Patient taking differently: Take 150 mcg by mouth daily before breakfast.) 30 tablet 2   nebivolol  (BYSTOLIC ) 10 MG tablet Take 10 mg by mouth daily.     pantoprazole  (PROTONIX ) 40 MG tablet TAKE 1 TABLET (40 MG TOTAL) BY  MOUTH DAILY. MUST KEEP OFFICE APPOINTMENT 90 tablet 1   No current facility-administered medications for this visit.    Allergies as of 11/15/2024 - Review Complete 11/27/2022  Allergen Reaction Noted   Lisinopril  05/25/2021   Lisinopril-hydrochlorothiazide  04/25/2020   Penicillins Rash 07/06/2013    Family History  Problem Relation Age of Onset   Hypertension Mother    Hypertension Father    Stroke Father    Esophageal cancer Neg Hx    Pancreatic cancer Neg Hx    Stomach cancer Neg Hx    Rectal cancer Neg Hx    Liver disease Neg Hx    Liver cancer Neg Hx    Colon cancer Neg Hx    Colon polyps Neg Hx     Social History   Socioeconomic History   Marital status: Single    Spouse name: Not on file   Number of children: 0   Years of education: Not on file   Highest education level: Associate degree: academic program  Occupational History   Not on file  Tobacco Use   Smoking status: Never   Smokeless tobacco: Never  Vaping Use   Vaping status: Never Used  Substance and Sexual Activity   Alcohol use: No   Drug use: Never   Sexual activity: Not on file  Other Topics Concern   Not on file  Social History Narrative  Caffeine- 1 cup per day   Right handed   Live at home alone       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/2021   Social Drivers of Health   Tobacco Use: Low Risk (11/27/2022)   Patient History    Smoking Tobacco Use: Never    Smokeless Tobacco Use: Never    Passive Exposure: Not on file  Financial Resource Strain: Not on file  Food Insecurity: Not on file  Transportation Needs: Not on file  Physical Activity: Not on file  Stress: Not on file  Social Connections: Not on file  Intimate Partner Violence: Not on file  Depression (PHQ2-9): Not on file  Alcohol Screen: Not on file  Housing: Not on file  Utilities: Not on file  Health Literacy: Not on file    Review of Systems:    Constitutional: No weight loss, fever, chills, weakness or fatigue HEENT: Eyes: No change  in vision               Ears, Nose, Throat:  No change in hearing or congestion Skin: No rash or itching Cardiovascular: No chest pain, chest pressure or palpitations   Respiratory: No SOB or cough Gastrointestinal: See HPI and otherwise negative Genitourinary: No dysuria or change in urinary frequency Neurological: No headache, dizziness or syncope Musculoskeletal: No new muscle or joint pain Hematologic: No bleeding or bruising Psychiatric: No history of depression or anxiety    Physical Exam:  Vital signs: There were no vitals taken for this visit.  Constitutional:   Pleasant Caucasian male appears to be in NAD, Well developed, Well nourished, alert and cooperative Head:  Normocephalic and atraumatic. Eyes:   PEERL, EOMI. No icterus. Conjunctiva pink. Ears:  Normal auditory acuity. Neck:  Supple Throat: Oral cavity and pharynx without inflammation, swelling or lesion.  Respiratory: Respirations even and unlabored. Lungs clear to auscultation bilaterally.   No wheezes, crackles, or rhonchi.  Cardiovascular: Normal S1, S2. No MRG. Regular rate and rhythm. No peripheral edema, cyanosis or pallor.  Gastrointestinal:  Soft, nondistended, nontender. No rebound or guarding. Normal bowel sounds. No appreciable masses or hepatomegaly. Rectal:  Not performed.  Msk:  Symmetrical without gross deformities. Without edema, no deformity or joint abnormality.  Neurologic:  Alert and  oriented x4;  grossly normal neurologically.  Skin:   Dry and intact without significant lesions or rashes. Psychiatric: Oriented to person, place and time. Demonstrates good judgement and reason without abnormal affect or behaviors.  RELEVANT LABS AND IMAGING: CBC    Component Value Date/Time   WBC 10.8 (H) 10/08/2019 2048   RBC 5.38 10/08/2019 2048   HGB 14.6 10/08/2019 2048   HCT 45.0 10/08/2019 2048   PLT 221 10/08/2019 2048   MCV 83.6 10/08/2019 2048   MCH 27.1 10/08/2019 2048   MCHC 32.4 10/08/2019  2048   RDW 14.3 10/08/2019 2048   LYMPHSABS 2.6 10/08/2019 2048   MONOABS 1.1 (H) 10/08/2019 2048   EOSABS 0.1 10/08/2019 2048   BASOSABS 0.1 10/08/2019 2048    CMP     Component Value Date/Time   NA 138 10/08/2019 2048   K 3.6 10/08/2019 2048   CL 103 10/08/2019 2048   CO2 27 10/08/2019 2048   GLUCOSE 118 (H) 10/08/2019 2048   BUN 27 (H) 10/08/2019 2048   CREATININE 1.42 (H) 10/08/2019 2048   CALCIUM  9.5 10/08/2019 2048   PROT 7.2 10/08/2019 2048   ALBUMIN 4.1 10/08/2019 2048   AST 38 10/08/2019 2048   ALT 33 10/08/2019 2048  ALKPHOS 73 10/08/2019 2048   BILITOT 0.5 10/08/2019 2048   GFRNONAA 58 (L) 10/08/2019 2048   GFRAA >60 10/08/2019 2048    Assessment: 1. ***  Plan: 1. ***     Delon Failing, PA-C [facility] 11/12/2024, 3:26 PM  Cc: Tisovec, Richard W, MD  "

## 2024-11-15 ENCOUNTER — Ambulatory Visit: Admitting: Physician Assistant

## 2024-12-08 ENCOUNTER — Ambulatory Visit: Admitting: Physician Assistant
